# Patient Record
Sex: Female | Born: 1985 | Race: White | Hispanic: No | Smoking: Current some day smoker
Health system: Southern US, Community
[De-identification: ages and names within clinical notes are randomized; demographics above are authoritative.]

## PROBLEM LIST (undated history)

## (undated) DIAGNOSIS — Z789 Other specified health status: Secondary | ICD-10-CM

## (undated) DIAGNOSIS — N200 Calculus of kidney: Secondary | ICD-10-CM

---

## 2017-05-26 DIAGNOSIS — Z23 Encounter for immunization: Secondary | ICD-10-CM | POA: Diagnosis not present

## 2017-05-26 DIAGNOSIS — Z3689 Encounter for other specified antenatal screening: Secondary | ICD-10-CM | POA: Diagnosis not present

## 2017-05-26 LAB — OB RESULTS CONSOLE HEPATITIS B SURFACE ANTIGEN: Hepatitis B Surface Ag: NEGATIVE

## 2017-05-26 LAB — OB RESULTS CONSOLE GC/CHLAMYDIA
Chlamydia: NEGATIVE
GC PROBE AMP, GENITAL: NEGATIVE

## 2017-05-26 LAB — OB RESULTS CONSOLE ABO/RH: RH TYPE: POSITIVE

## 2017-05-26 LAB — OB RESULTS CONSOLE RUBELLA ANTIBODY, IGM: RUBELLA: IMMUNE

## 2017-05-26 LAB — OB RESULTS CONSOLE RPR: RPR: NONREACTIVE

## 2017-05-26 LAB — OB RESULTS CONSOLE ANTIBODY SCREEN: Antibody Screen: NEGATIVE

## 2017-05-26 LAB — OB RESULTS CONSOLE HIV ANTIBODY (ROUTINE TESTING): HIV: NONREACTIVE

## 2017-06-30 DIAGNOSIS — O365931 Maternal care for other known or suspected poor fetal growth, third trimester, fetus 1: Secondary | ICD-10-CM | POA: Diagnosis not present

## 2017-06-30 DIAGNOSIS — Z3A33 33 weeks gestation of pregnancy: Secondary | ICD-10-CM | POA: Diagnosis not present

## 2017-06-30 DIAGNOSIS — O34211 Maternal care for low transverse scar from previous cesarean delivery: Secondary | ICD-10-CM | POA: Diagnosis not present

## 2017-07-19 DIAGNOSIS — Z3685 Encounter for antenatal screening for Streptococcus B: Secondary | ICD-10-CM | POA: Diagnosis not present

## 2017-07-19 LAB — OB RESULTS CONSOLE GBS: GBS: NEGATIVE

## 2017-07-20 DIAGNOSIS — Z3A36 36 weeks gestation of pregnancy: Secondary | ICD-10-CM | POA: Diagnosis not present

## 2017-07-20 DIAGNOSIS — O365930 Maternal care for other known or suspected poor fetal growth, third trimester, not applicable or unspecified: Secondary | ICD-10-CM | POA: Diagnosis not present

## 2017-07-20 DIAGNOSIS — O34211 Maternal care for low transverse scar from previous cesarean delivery: Secondary | ICD-10-CM | POA: Diagnosis not present

## 2017-07-31 ENCOUNTER — Encounter (HOSPITAL_COMMUNITY): Payer: Self-pay | Admitting: *Deleted

## 2017-08-08 NOTE — Patient Instructions (Signed)
Melissa Wiggins  08/08/2017   Your procedure is scheduled on:  08/10/2017  Enter through the Main Entr[facility] of [facility] at 0730 AM.  Pick up the phone at the desk and dial [phone]  Call this number if you have problems the morning of [phone]  Remember:   Do not eat food:(After Midnight) Desps de medianoche.  Do not drink clear liquids: (After Midnight) Desps de medianoche.  Take these medicines the morning of surgery with A SIP OF WATER: none   Do not wear jewelry, make-up or nail polish.  Do not wear lotions, powders, or perfumes. Do not wear deodorant.  Do not shave 48 hours prior to surgery.  Do not bring valuables to the hospital.  [facility] is not   responsible for any belongings or valuables brought to the hospital.  Contacts, dentures or bridgework may not be worn into surgery.  Leave suitcase in the car. After surgery it may be brought to your room.  For patients admitted to the hospital, checkout time is 11:00 AM the day of              discharge.    N/A   Please read over the following fact sheets that you were given:   Surgical Site Infection Prevention

## 2017-08-09 ENCOUNTER — Encounter (HOSPITAL_COMMUNITY)
Admission: RE | Admit: 2017-08-09 | Discharge: 2017-08-09 | Disposition: A | Discharge status: Home / Self Care | Payer: BLUE CROSS/BLUE SHIELD | Source: Ambulatory Visit | Attending: Obstetrics and Gynecology | Admitting: Obstetrics and Gynecology

## 2017-08-09 DIAGNOSIS — O9902 Anemia complicating childbirth: Secondary | ICD-10-CM | POA: Diagnosis not present

## 2017-08-09 DIAGNOSIS — Z3A39 39 weeks gestation of pregnancy: Secondary | ICD-10-CM | POA: Diagnosis not present

## 2017-08-09 DIAGNOSIS — O34211 Maternal care for low transverse scar from previous cesarean delivery: Secondary | ICD-10-CM | POA: Diagnosis not present

## 2017-08-09 DIAGNOSIS — O99334 Smoking (tobacco) complicating childbirth: Secondary | ICD-10-CM | POA: Diagnosis not present

## 2017-08-09 DIAGNOSIS — F17210 Nicotine dependence, cigarettes, uncomplicated: Secondary | ICD-10-CM | POA: Diagnosis not present

## 2017-08-09 DIAGNOSIS — Z23 Encounter for immunization: Secondary | ICD-10-CM | POA: Diagnosis not present

## 2017-08-09 HISTORY — DX: Other specified health status: Z78.9

## 2017-08-09 LAB — TYPE AND SCREEN
ABO/RH(D): A POS
Antibody Screen: NEGATIVE

## 2017-08-09 LAB — CBC
HCT: 31.6 % — ABNORMAL LOW (ref 36.0–46.0)
Hemoglobin: 10.8 g/dL — ABNORMAL LOW (ref 12.0–15.0)
MCH: 30.3 pg (ref 26.0–34.0)
MCHC: 34.2 g/dL (ref 30.0–36.0)
MCV: 88.5 fL (ref 78.0–100.0)
PLATELETS: 240 10*3/uL (ref 150–400)
RBC: 3.57 MIL/uL — AB (ref 3.87–5.11)
RDW: 12.9 % (ref 11.5–15.5)
WBC: 11.1 10*3/uL — AB (ref 4.0–10.5)

## 2017-08-09 LAB — ABO/RH: ABO/RH(D): A POS

## 2017-08-10 ENCOUNTER — Other Ambulatory Visit: Payer: Self-pay

## 2017-08-10 ENCOUNTER — Inpatient Hospital Stay (HOSPITAL_COMMUNITY)
Admission: AD | Admit: 2017-08-10 | Discharge: 2017-08-11 | DRG: 788 | Disposition: A | Discharge status: Home / Self Care | Payer: BLUE CROSS/BLUE SHIELD | Source: Ambulatory Visit | Attending: Obstetrics and Gynecology | Admitting: Obstetrics and Gynecology

## 2017-08-10 ENCOUNTER — Encounter (HOSPITAL_COMMUNITY)
Admission: AD | Disposition: A | Discharge status: Home / Self Care | Payer: Self-pay | Source: Ambulatory Visit | Attending: Obstetrics and Gynecology

## 2017-08-10 ENCOUNTER — Inpatient Hospital Stay (HOSPITAL_COMMUNITY): Payer: BLUE CROSS/BLUE SHIELD | Admitting: Certified Registered Nurse Anesthetist

## 2017-08-10 ENCOUNTER — Encounter (HOSPITAL_COMMUNITY): Payer: Self-pay | Admitting: Certified Registered Nurse Anesthetist

## 2017-08-10 DIAGNOSIS — Z98891 History of uterine scar from previous surgery: Secondary | ICD-10-CM

## 2017-08-10 DIAGNOSIS — O34211 Maternal care for low transverse scar from previous cesarean delivery: Secondary | ICD-10-CM | POA: Diagnosis present

## 2017-08-10 DIAGNOSIS — O34219 Maternal care for unspecified type scar from previous cesarean delivery: Secondary | ICD-10-CM

## 2017-08-10 DIAGNOSIS — O99334 Smoking (tobacco) complicating childbirth: Secondary | ICD-10-CM | POA: Diagnosis present

## 2017-08-10 DIAGNOSIS — Z3A39 39 weeks gestation of pregnancy: Secondary | ICD-10-CM | POA: Diagnosis not present

## 2017-08-10 DIAGNOSIS — F17210 Nicotine dependence, cigarettes, uncomplicated: Secondary | ICD-10-CM | POA: Diagnosis present

## 2017-08-10 LAB — RPR: RPR: NONREACTIVE

## 2017-08-10 SURGERY — Surgical Case
Anesthesia: Spinal

## 2017-08-10 MED ORDER — FENTANYL CITRATE (PF) 100 MCG/2ML IJ SOLN: 25.0000 ug | INTRAMUSCULAR | Status: DC | PRN

## 2017-08-10 MED ORDER — NALBUPHINE HCL 10 MG/ML IJ SOLN: 5.0000 mg | Freq: Once | INTRAMUSCULAR | Status: DC | PRN

## 2017-08-10 MED ORDER — FENTANYL CITRATE (PF) 100 MCG/2ML IJ SOLN
INTRAMUSCULAR | Status: AC
  Filled 2017-08-10: qty 2

## 2017-08-10 MED ORDER — CEFAZOLIN SODIUM-DEXTROSE 2-4 GM/100ML-% IV SOLN
2.0000 g | INTRAVENOUS | Status: AC
  Administered 2017-08-10: 2 g via INTRAVENOUS
  Filled 2017-08-10: qty 100

## 2017-08-10 MED ORDER — ONDANSETRON HCL 4 MG/2ML IJ SOLN
INTRAMUSCULAR | Status: DC | PRN
  Administered 2017-08-10: 4 mg via INTRAVENOUS

## 2017-08-10 MED ORDER — PRENATAL MULTIVITAMIN CH
1.0000 | ORAL_TABLET | Freq: Every day | ORAL | Status: DC
  Administered 2017-08-11: 1 via ORAL
  Filled 2017-08-10: qty 1

## 2017-08-10 MED ORDER — NALBUPHINE HCL 10 MG/ML IJ SOLN: 5.0000 mg | INTRAMUSCULAR | Status: DC | PRN

## 2017-08-10 MED ORDER — SCOPOLAMINE 1 MG/3DAYS TD PT72
1.0000 | MEDICATED_PATCH | Freq: Once | TRANSDERMAL | Status: DC
  Filled 2017-08-10: qty 1

## 2017-08-10 MED ORDER — OXYCODONE HCL 5 MG PO TABS: 10.0000 mg | ORAL_TABLET | ORAL | Status: DC | PRN

## 2017-08-10 MED ORDER — IBUPROFEN 600 MG PO TABS
600.0000 mg | ORAL_TABLET | Freq: Four times a day (QID) | ORAL | Status: DC
  Administered 2017-08-10 – 2017-08-11 (×4): 600 mg via ORAL
  Filled 2017-08-10 (×4): qty 1

## 2017-08-10 MED ORDER — LACTATED RINGERS IV SOLN
INTRAVENOUS | Status: DC
  Administered 2017-08-10 (×2): via INTRAVENOUS

## 2017-08-10 MED ORDER — DIPHENHYDRAMINE HCL 25 MG PO CAPS: 25.0000 mg | ORAL_CAPSULE | Freq: Four times a day (QID) | ORAL | Status: DC | PRN

## 2017-08-10 MED ORDER — ONDANSETRON HCL 4 MG/2ML IJ SOLN: 4.0000 mg | Freq: Three times a day (TID) | INTRAMUSCULAR | Status: DC | PRN

## 2017-08-10 MED ORDER — SIMETHICONE 80 MG PO CHEW
80.0000 mg | CHEWABLE_TABLET | Freq: Three times a day (TID) | ORAL | Status: DC
  Administered 2017-08-10 – 2017-08-11 (×2): 80 mg via ORAL
  Filled 2017-08-10 (×2): qty 1

## 2017-08-10 MED ORDER — ACETAMINOPHEN 325 MG PO TABS: 650.0000 mg | ORAL_TABLET | ORAL | Status: DC | PRN

## 2017-08-10 MED ORDER — DEXAMETHASONE SODIUM PHOSPHATE 10 MG/ML IJ SOLN
INTRAMUSCULAR | Status: DC | PRN
  Administered 2017-08-10: 4 mg via INTRAVENOUS

## 2017-08-10 MED ORDER — LACTATED RINGERS IV SOLN
INTRAVENOUS | Status: DC
  Administered 2017-08-10 (×2): 125 mL/h via INTRAVENOUS

## 2017-08-10 MED ORDER — OXYTOCIN 40 UNITS IN LACTATED RINGERS INFUSION - SIMPLE MED: 2.5000 [IU]/h | INTRAVENOUS | Status: AC

## 2017-08-10 MED ORDER — DEXAMETHASONE SODIUM PHOSPHATE 4 MG/ML IJ SOLN
INTRAMUSCULAR | Status: AC
Start: 2017-08-10 — End: ?
  Filled 2017-08-10: qty 1

## 2017-08-10 MED ORDER — NALOXONE HCL 4 MG/10ML IJ SOLN
1.0000 ug/kg/h | INTRAVENOUS | Status: DC | PRN
  Filled 2017-08-10: qty 5

## 2017-08-10 MED ORDER — PROMETHAZINE HCL 25 MG/ML IJ SOLN: 6.2500 mg | INTRAMUSCULAR | Status: DC | PRN

## 2017-08-10 MED ORDER — DIBUCAINE 1 % RE OINT: 1.0000 "application " | TOPICAL_OINTMENT | RECTAL | Status: DC | PRN

## 2017-08-10 MED ORDER — SOD CITRATE-CITRIC ACID 500-334 MG/5ML PO SOLN
30.0000 mL | Freq: Once | ORAL | Status: AC
  Administered 2017-08-10: 30 mL via ORAL
  Filled 2017-08-10: qty 15

## 2017-08-10 MED ORDER — SODIUM CHLORIDE 0.9% FLUSH: 3.0000 mL | INTRAVENOUS | Status: DC | PRN

## 2017-08-10 MED ORDER — NALOXONE HCL 0.4 MG/ML IJ SOLN: 0.4000 mg | INTRAMUSCULAR | Status: DC | PRN

## 2017-08-10 MED ORDER — PHENYLEPHRINE 8 MG IN D5W 100 ML (0.08MG/ML) PREMIX OPTIME
INJECTION | INTRAVENOUS | Status: DC | PRN
  Administered 2017-08-10: 60 ug/min via INTRAVENOUS

## 2017-08-10 MED ORDER — FAMOTIDINE 20 MG PO TABS
20.0000 mg | ORAL_TABLET | Freq: Once | ORAL | Status: AC
  Administered 2017-08-10: 20 mg via ORAL
  Filled 2017-08-10: qty 1

## 2017-08-10 MED ORDER — PHENYLEPHRINE 40 MCG/ML (10ML) SYRINGE FOR IV PUSH (FOR BLOOD PRESSURE SUPPORT)
PREFILLED_SYRINGE | INTRAVENOUS | Status: AC
  Filled 2017-08-10: qty 10

## 2017-08-10 MED ORDER — KETOROLAC TROMETHAMINE 30 MG/ML IJ SOLN
INTRAMUSCULAR | Status: AC
  Administered 2017-08-10: 30 mg via INTRAMUSCULAR
  Filled 2017-08-10: qty 1

## 2017-08-10 MED ORDER — KETOROLAC TROMETHAMINE 30 MG/ML IJ SOLN: 30.0000 mg | Freq: Four times a day (QID) | INTRAMUSCULAR | Status: AC | PRN

## 2017-08-10 MED ORDER — MORPHINE SULFATE (PF) 0.5 MG/ML IJ SOLN
INTRAMUSCULAR | Status: AC
  Filled 2017-08-10: qty 10

## 2017-08-10 MED ORDER — FENTANYL CITRATE (PF) 100 MCG/2ML IJ SOLN
INTRAMUSCULAR | Status: DC | PRN
  Administered 2017-08-10: 10 ug via INTRATHECAL

## 2017-08-10 MED ORDER — COCONUT OIL OIL: 1.0000 "application " | TOPICAL_OIL | Status: DC | PRN

## 2017-08-10 MED ORDER — LACTATED RINGERS IV SOLN
INTRAVENOUS | Status: DC | PRN
Start: 2017-08-10 — End: 2017-08-10
  Administered 2017-08-10: 10:00:00 via INTRAVENOUS

## 2017-08-10 MED ORDER — MEPERIDINE HCL 25 MG/ML IJ SOLN: 6.2500 mg | INTRAMUSCULAR | Status: DC | PRN

## 2017-08-10 MED ORDER — WITCH HAZEL-GLYCERIN EX PADS: 1.0000 "application " | MEDICATED_PAD | CUTANEOUS | Status: DC | PRN

## 2017-08-10 MED ORDER — TETANUS-DIPHTH-ACELL PERTUSSIS 5-2.5-18.5 LF-MCG/0.5 IM SUSP: 0.5000 mL | Freq: Once | INTRAMUSCULAR | Status: DC

## 2017-08-10 MED ORDER — DIPHENHYDRAMINE HCL 50 MG/ML IJ SOLN: 12.5000 mg | INTRAMUSCULAR | Status: DC | PRN

## 2017-08-10 MED ORDER — ONDANSETRON HCL 4 MG/2ML IJ SOLN
INTRAMUSCULAR | Status: AC
  Filled 2017-08-10: qty 2

## 2017-08-10 MED ORDER — MORPHINE SULFATE (PF) 0.5 MG/ML IJ SOLN
INTRAMUSCULAR | Status: DC | PRN
  Administered 2017-08-10: .2 mg via INTRATHECAL

## 2017-08-10 MED ORDER — MENTHOL 3 MG MT LOZG: 1.0000 | LOZENGE | OROMUCOSAL | Status: DC | PRN

## 2017-08-10 MED ORDER — OXYTOCIN 10 UNIT/ML IJ SOLN
INTRAVENOUS | Status: DC | PRN
  Administered 2017-08-10: 40 [IU] via INTRAVENOUS

## 2017-08-10 MED ORDER — OXYCODONE HCL 5 MG PO TABS: 5.0000 mg | ORAL_TABLET | ORAL | Status: DC | PRN

## 2017-08-10 MED ORDER — PHENYLEPHRINE 8 MG IN D5W 100 ML (0.08MG/ML) PREMIX OPTIME
INJECTION | INTRAVENOUS | Status: AC
  Filled 2017-08-10: qty 100

## 2017-08-10 MED ORDER — SENNOSIDES-DOCUSATE SODIUM 8.6-50 MG PO TABS
2.0000 | ORAL_TABLET | ORAL | Status: DC
  Administered 2017-08-11: 2 via ORAL
  Filled 2017-08-10: qty 2

## 2017-08-10 MED ORDER — SCOPOLAMINE 1 MG/3DAYS TD PT72
1.0000 | MEDICATED_PATCH | Freq: Once | TRANSDERMAL | Status: DC
  Administered 2017-08-10: 1.5 mg via TRANSDERMAL

## 2017-08-10 MED ORDER — SIMETHICONE 80 MG PO CHEW: 80.0000 mg | CHEWABLE_TABLET | ORAL | Status: DC | PRN

## 2017-08-10 MED ORDER — KETOROLAC TROMETHAMINE 30 MG/ML IJ SOLN
30.0000 mg | Freq: Four times a day (QID) | INTRAMUSCULAR | Status: AC | PRN
  Administered 2017-08-10: 30 mg via INTRAMUSCULAR

## 2017-08-10 MED ORDER — BUPIVACAINE IN DEXTROSE 0.75-8.25 % IT SOLN
INTRATHECAL | Status: DC | PRN
  Administered 2017-08-10: 1.6 mL via INTRATHECAL

## 2017-08-10 MED ORDER — SIMETHICONE 80 MG PO CHEW
80.0000 mg | CHEWABLE_TABLET | ORAL | Status: DC
  Administered 2017-08-11: 80 mg via ORAL
  Filled 2017-08-10: qty 1

## 2017-08-10 MED ORDER — DIPHENHYDRAMINE HCL 25 MG PO CAPS
25.0000 mg | ORAL_CAPSULE | ORAL | Status: DC | PRN
  Filled 2017-08-10: qty 1

## 2017-08-10 MED ORDER — ZOLPIDEM TARTRATE 5 MG PO TABS: 5.0000 mg | ORAL_TABLET | Freq: Every evening | ORAL | Status: DC | PRN

## 2017-08-10 MED ORDER — OXYTOCIN 10 UNIT/ML IJ SOLN
INTRAMUSCULAR | Status: AC
  Filled 2017-08-10: qty 4

## 2017-08-10 MED ORDER — ACETAMINOPHEN 500 MG PO TABS
1000.0000 mg | ORAL_TABLET | Freq: Four times a day (QID) | ORAL | Status: AC
  Administered 2017-08-10 – 2017-08-11 (×4): 1000 mg via ORAL
  Filled 2017-08-10 (×4): qty 2

## 2017-08-10 SURGICAL SUPPLY — 35 items
BENZOIN TINCTURE PRP APPL 2/3 (GAUZE/BANDAGES/DRESSINGS) ×6 IMPLANT
CHLORAPREP W/TINT 26ML (MISCELLANEOUS) ×3 IMPLANT
CLAMP CORD UMBIL (MISCELLANEOUS) ×3 IMPLANT
CLOSURE WOUND 1/2 X4 (GAUZE/BANDAGES/DRESSINGS) ×2
CLOTH BEACON ORANGE TIMEOUT ST (SAFETY) ×3 IMPLANT
DRAPE C SECTION CLR SCREEN (DRAPES) ×3 IMPLANT
DRSG OPSITE POSTOP 4X10 (GAUZE/BANDAGES/DRESSINGS) ×3 IMPLANT
ELECT REM PT RETURN 9FT ADLT (ELECTROSURGICAL) ×3
ELECTRODE REM PT RTRN 9FT ADLT (ELECTROSURGICAL) ×1 IMPLANT
GLOVE BIO SURGEON STRL SZ 6.5 (GLOVE) ×2 IMPLANT
GLOVE BIO SURGEONS STRL SZ 6.5 (GLOVE) ×1
GLOVE BIOGEL PI IND STRL 7.0 (GLOVE) ×2 IMPLANT
GLOVE BIOGEL PI INDICATOR 7.0 (GLOVE) ×4
GOWN STRL REUS W/TWL LRG LVL3 (GOWN DISPOSABLE) ×6 IMPLANT
HEMOSTAT ARISTA ABSORB 3G PWDR (MISCELLANEOUS) ×3 IMPLANT
NS IRRIG 1000ML POUR BTL (IV SOLUTION) ×3 IMPLANT
PACK C SECTION WH (CUSTOM PROCEDURE TRAY) ×3 IMPLANT
PAD OB MATERNITY 4.3X12.25 (PERSONAL CARE ITEMS) ×3 IMPLANT
RETRACTOR WND ALEXIS 25 LRG (MISCELLANEOUS) ×1 IMPLANT
RTRCTR C-SECT PINK 25CM LRG (MISCELLANEOUS) ×3 IMPLANT
RTRCTR WOUND ALEXIS 25CM LRG (MISCELLANEOUS) ×3
STRIP CLOSURE SKIN 1/2X4 (GAUZE/BANDAGES/DRESSINGS) ×4 IMPLANT
SUT CHROMIC 1 CTX 36 (SUTURE) ×6 IMPLANT
SUT PLAIN 2 0 XLH (SUTURE) ×3 IMPLANT
SUT VIC AB 0 CT1 27 (SUTURE) ×4
SUT VIC AB 0 CT1 27XBRD ANBCTR (SUTURE) ×2 IMPLANT
SUT VIC AB 2-0 CT1 27 (SUTURE) ×2
SUT VIC AB 2-0 CT1 TAPERPNT 27 (SUTURE) ×1 IMPLANT
SUT VIC AB 3-0 SH 27 (SUTURE) ×2
SUT VIC AB 3-0 SH 27X BRD (SUTURE) ×1 IMPLANT
SUT VIC AB 4-0 KS 27 (SUTURE) ×3 IMPLANT
SUT VIC AB 4-0 SH 27 (SUTURE) ×2
SUT VIC AB 4-0 SH 27XANBCTRL (SUTURE) ×1 IMPLANT
TOWEL OR 17X24 6PK STRL BLUE (TOWEL DISPOSABLE) ×3 IMPLANT
TRAY FOLEY W/BAG SLVR 14FR LF (SET/KITS/TRAYS/PACK) ×3 IMPLANT

## 2017-08-10 NOTE — Anesthesia Procedure Notes (Signed)
Spinal  Patient location during procedure: OR Start time: 08/10/2017 9:31 AM End time: 08/10/2017 9:34 AM Staffing Anesthesiologist: Cecile Hearingurk, Stephen Edward, MD Performed: anesthesiologist  Preanesthetic Checklist Completed: patient identified, surgical consent, pre-op evaluation, timeout performed, IV checked, risks and benefits discussed and monitors and equipment checked Spinal Block Patient position: sitting Prep: site prepped and draped and DuraPrep Patient monitoring: continuous pulse ox and blood pressure Approach: midline Location: L3-4 Injection technique: single-shot Needle Needle type: Pencan  Needle gauge: 24 G Assessment Sensory level: T4 Additional Notes Functioning IV was confirmed and monitors were applied. Sterile prep and drape, including hand hygiene, mask and sterile gloves were used. The patient was positioned and the spine was prepped. The skin was anesthetized with lidocaine.  Free flow of clear CSF was obtained prior to injecting local anesthetic into the CSF.  The spinal needle aspirated freely following injection.  The needle was carefully withdrawn.  The patient tolerated the procedure well. Consent was obtained prior to procedure with all questions answered and concerns addressed. Risks including but not limited to bleeding, infection, nerve damage, paralysis, failed block, inadequate analgesia, allergic reaction, high spinal, itching and headache were discussed and the patient wished to proceed.   Arrie AranStephen Turk, MD

## 2017-08-10 NOTE — Transfer of Care (Signed)
Immediate Anesthesia Transfer of Care Note  Patient: Melissa Wiggins  Procedure(s) Performed: REPEAT CESAREAN SECTION (N/A )  Patient Location: PACU  Anesthesia Type:Spinal  Level of Consciousness: awake, alert  and oriented  Airway & Oxygen Therapy: Patient Spontanous Breathing  Post-op Assessment: Report given to RN and Post -op Vital signs reviewed and stable  Post vital signs: Reviewed and stable  Last Vitals:  Vitals Value Taken Time  BP    Temp    Pulse    Resp    SpO2      Last Pain:  Vitals:   08/10/17 0805  TempSrc: Oral      Patients Stated Pain Goal: 4 (08/10/17 0757)  Complications: No apparent anesthesia complications

## 2017-08-10 NOTE — Consult Note (Signed)
Neonatology Note:   Attendance at C-section:    I was asked by Dr. Banga to attend this repeat C/S at term. The mother is a G3P1, GBS negative with good prenatal care. ROM 0 hours before delivery, fluid clear. Infant vigorous with good spontaneous cry and tone. Needed only minimal bulb suctioning. Ap 8/9. Lungs clear to ausc in DR. To CN to care of Pediatrician.  Amethyst Gainer C. Dharma Pare, MD 

## 2017-08-10 NOTE — Op Note (Signed)
Operative Note    Preoperative Diagnosis: 1. Prior cesarean section desiring elective repeat                                              2. IUP at 39 weeks   Postoperative Diagnosis: Same   Procedure: Repeat low transverse cesarean section   Surgeon: Britt BottomBanga C DO Assist: Surgical Assist Marchelle FolksHeather Krietenmeyer  Anesthesia: Spinal   Fluids: LR 1900ml EBL: 738ml UOP:16225ml   Findings: viable female infant in cephalic position; grossly normal uterus, tubes and ovaries   Specimen: placenta to pathology   Procedure Note Consent verified pre-op. All questions answered   Patient was taken to the operating room where spinal anesthesia was administered. She was prepped and draped in the normal sterile fashion and placed in the dorsal supine position with a leftward tilt. An appropriate time out was performed. Her old incision was removed with the scalpel and carried through to the underlying layer of fascia. The fascia was nicked in the midline and the incision was extended laterally with Mayo scissors. The superior and inferior aspects of the incision were grasped Coker clamps and dissected off the underlying rectus muscles.Rectus muscles were separated in the midline  and the peritoneal cavity entered bluntly. The peritoneal incision was then extended both superiorly and inferiorly with careful attention to avoid both bowel and bladder. The Alexis self-retaining wound retractor was then placed and the lower uterine segment exposed. The bladder flap was developed with Metzenbaum scissors and pushed away from the lower uterine segment. The lower uterine segment was then incised in a transverse fashion and the cavity itself entered bluntly. The incision was extended bluntly. The infant's head was then lifted and delivered from the incision without difficulty. Vigorous spontaneous cry was noted during delivery.  The remainder of the infant delivered and the nose and mouth bulb suctioned. The cord was  clamped and cut after a minute delay. The infant was handed off to the waiting pediatricians. The placenta was then manually expressed from the uterus and the uterus cleared of all clots and debris with moist lap sponge. The uterine incision was then repaired in 2 layers the first layer was a running locked layer 1-0 chromic and the second an imbricating layer of the same suture. There was an extension of the incision into the broad ligament on the left- although hemostatic, this was repaired with 3-0 vicryl on an SH in a running fashion.  The tubes and ovaries were inspected and the gutters cleared of all clots and debris. The uterine incision was inspected again and found to be hemostatic. All instruments and sponges were then removed from the abdomen. The peritoneum was then reapproximated in a running fashion with sutures of 2-0 Vicryl; the rectus was incorporated for support.The fascia was then closed with 0 Vicryl in a running fashion. Subcutaneous tissue was reapproximated with 3-0 plain in a running fashion. The skin was closed with a subcuticular stitch of 4-0 Vicryl on a Keith needle and then reinforced with benzoin and Steri-Strips. At the conclusion of the procedure all instruments and sponge counts were correct. Patient was taken to the recovery room in good condition with her baby accompanying her skin to skin.  Apgars 9,9

## 2017-08-10 NOTE — Anesthesia Postprocedure Evaluation (Signed)
Anesthesia Post Note  Patient: Percell Beltmanda Brocious  Procedure(s) Performed: REPEAT CESAREAN SECTION (N/A )     Patient location during evaluation: PACU Anesthesia Type: Spinal Level of consciousness: oriented and awake and alert Pain management: pain level controlled Vital Signs Assessment: post-procedure vital signs reviewed and stable Respiratory status: spontaneous breathing and respiratory function stable Cardiovascular status: blood pressure returned to baseline and stable Postop Assessment: no headache, no backache and no apparent nausea or vomiting Anesthetic complications: no    Last Vitals:  Vitals:   08/10/17 1705 08/10/17 1707  BP:    Pulse:    Resp:    Temp:    SpO2: 99% 100%    Last Pain:  Vitals:   08/10/17 1659  TempSrc: Oral  PainSc:    Pain Goal: Patients Stated Pain Goal: 0 (08/10/17 1145)               Lowella CurbWarren Ray Leondro Coryell

## 2017-08-10 NOTE — H&P (Signed)
Melissa Wiggins is a 32 y.o. G93P1011 female presenting at 11 0/[redacted]wks gestation for scheduled elective repeat cesarean section. She had her first cesarean due to breech presentation. Pt transferred into my practice at [redacted] weeks gestation. She had declined all genetic and elective screening tests. She has a history of depression but was controlled with no meds throughout pregnancy. She is slightly anxious as expected today.  OB History    Gravida  3   Para  1   Term  1   Preterm      AB  1   Living  1     SAB      TAB  1   Ectopic      Multiple      Live Births  1          Past Medical History:  Diagnosis Date  . Medical history non-contributory    Past Surgical History:  Procedure Laterality Date  . CESAREAN SECTION     Family History: family history includes Diabetes in her father. Social History:  reports that she has been smoking.  She has been smoking about 0.25 packs per day. She has never used smokeless tobacco. She reports that she drank alcohol. She reports that she does not use drugs.     Maternal Diabetes: No Genetic Screening: Declined Maternal Ultrasounds/Referrals: Normal Fetal Ultrasounds or other Referrals:  None Maternal Substance Abuse:  No Significant Maternal Medications:  None Significant Maternal Lab Results:  Lab values include: Group B Strep negative Other Comments:  None  Review of Systems  Constitutional: Negative for chills, fever, malaise/fatigue and weight loss.  Eyes: Negative for blurred vision and double vision.  Respiratory: Negative for shortness of breath.   Cardiovascular: Negative for chest pain.  Gastrointestinal: Negative for abdominal pain, heartburn, nausea and vomiting.  Genitourinary: Negative for dysuria.  Musculoskeletal: Negative for back pain and myalgias.  Skin: Negative for itching and rash.  Neurological: Negative for dizziness and headaches.  Endo/Heme/Allergies: Does not bruise/bleed easily.   Psychiatric/Behavioral: Negative for depression, hallucinations, substance abuse and suicidal ideas. The patient is nervous/anxious.    Maternal Medical History:  Reason for admission: Nausea. Scheduled for repeat cesarean section - elective  Contractions: Frequency: rare.    Fetal activity: Perceived fetal activity is normal.   Last perceived fetal movement was within the past hour.    Prenatal complications: no prenatal complications Prenatal Complications - Diabetes: none.      Blood pressure 111/70, pulse 98, temperature 98.3 F (36.8 C), temperature source Oral, resp. rate 18, height 5\' 5"  (1.651 m), weight 157 lb 9.6 oz (71.5 kg), last menstrual period 11/10/2016. Maternal Exam:  Uterine Assessment: Contraction frequency is rare.   Abdomen: Patient reports generalized tenderness.  Estimated fetal weight is AGA.   Fetal presentation: vertex  Introitus: Normal vulva. Vulva is negative for condylomata and lesion.  Normal vagina.  Vagina is negative for condylomata.    Physical Exam  Constitutional: She is oriented to person, place, and time. She appears well-developed and well-nourished.  Eyes: Pupils are equal, round, and reactive to light.  Neck: Normal range of motion.  Cardiovascular: Normal rate.  Respiratory: Effort normal.  GI: Soft. There is generalized tenderness.  Genitourinary: Vagina normal and uterus normal. Vulva exhibits no lesion.  Musculoskeletal: Normal range of motion. She exhibits no edema.  Neurological: She is alert and oriented to person, place, and time. She has normal reflexes.  Skin: Skin is warm and dry.  Psychiatric: She has  a normal mood and affect. Her behavior is normal. Judgment and thought content normal.    Prenatal labs: ABO, Rh: --/--/A POS, A POS Performed at [facility], [address] [place], [place] [address]  [phone](06/19 1545) Antibody: NEG (06/19 1545) Rubella: Immune (04/05 0000) RPR: Non Reactive (06/19 1545)  HBsAg:  Negative (04/05 0000)  HIV: Non-reactive (04/05 0000)  GBS: Negative (05/29 0000)   Assessment/Plan: [id number] at 6839 0/[redacted]wks gestation for elective repeat cesarean section Risks and benefits of procedure have been discussed and all questions answered To OR when ready for repeat cesarean section  Cecilia W Banga 08/10/2017, 9:17 AM

## 2017-08-10 NOTE — Anesthesia Preprocedure Evaluation (Addendum)
Anesthesia Evaluation  Patient identified by MRN, date of birth, ID band Patient awake    Reviewed: Allergy & Precautions, NPO status , Patient's Chart, lab work & pertinent test results  Airway Mallampati: II  TM Distance: >3 FB Neck ROM: Full    Dental  (+) Teeth Intact, Dental Advisory Given   Pulmonary Current Smoker,    Pulmonary exam normal breath sounds clear to auscultation       Cardiovascular Exercise Tolerance: Good negative cardio ROS Normal cardiovascular exam Rhythm:Regular Rate:Normal     Neuro/Psych negative neurological ROS  negative psych ROS   GI/Hepatic negative GI ROS, Neg liver ROS,   Endo/Other  negative endocrine ROS  Renal/GU negative Renal ROS     Musculoskeletal negative musculoskeletal ROS (+)   Abdominal   Peds  Hematology  (+) Blood dyscrasia, anemia , Plt 240k   Anesthesia Other Findings Day of surgery medications reviewed with the patient.  Reproductive/Obstetrics (+) Pregnancy H/o C-section                              Anesthesia Physical Anesthesia Plan  ASA: II  Anesthesia Plan: Spinal   Post-op Pain Management:    Induction:   PONV Risk Score and Plan: 1 and Scopolamine patch - Pre-op, Treatment may vary due to age or medical condition, Dexamethasone and Ondansetron  Airway Management Planned: Natural Airway  Additional Equipment:   Intra-op Plan:   Post-operative Plan:   Informed Consent: I have reviewed the patients History and Physical, chart, labs and discussed the procedure including the risks, benefits and alternatives for the proposed anesthesia with the patient or authorized representative who has indicated his/her understanding and acceptance.   Dental advisory given  Plan Discussed with: CRNA, Anesthesiologist and Surgeon  Anesthesia Plan Comments: (Discussed risks and benefits of and differences between spinal and general.  Discussed risks of spinal including headache, backache, failure, bleeding, infection, and nerve damage. Patient consents to spinal. Questions answered. Coagulation studies and platelet count acceptable.  Patient states she required anxiolytics after last baby delivered while still in OR.)       Anesthesia Quick Evaluation

## 2017-08-11 LAB — CBC
HCT: 26.5 % — ABNORMAL LOW (ref 36.0–46.0)
Hemoglobin: 9.2 g/dL — ABNORMAL LOW (ref 12.0–15.0)
MCH: 31.2 pg (ref 26.0–34.0)
MCHC: 34.7 g/dL (ref 30.0–36.0)
MCV: 89.8 fL (ref 78.0–100.0)
PLATELETS: 221 10*3/uL (ref 150–400)
RBC: 2.95 MIL/uL — AB (ref 3.87–5.11)
RDW: 13.3 % (ref 11.5–15.5)
WBC: 12.4 10*3/uL — AB (ref 4.0–10.5)

## 2017-08-11 LAB — BIRTH TISSUE RECOVERY COLLECTION (PLACENTA DONATION)

## 2017-08-11 MED ORDER — IBUPROFEN 600 MG PO TABS: 600.0000 mg | ORAL_TABLET | Freq: Four times a day (QID) | ORAL | 1 refills | Status: DC | PRN

## 2017-08-11 MED ORDER — OXYCODONE HCL 5 MG PO TABS: 5.0000 mg | ORAL_TABLET | ORAL | 0 refills | Status: AC | PRN

## 2017-08-11 NOTE — Discharge Summary (Signed)
OB Discharge Summary     Patient Name: Melissa Wiggins DOB: February 17, 1986 MRN: [id number]  Date of admission: 08/10/2017 Delivering MD: Pryor OchoaBANGA, [facility]   Date of discharge: 08/11/2017  Admitting diagnosis: repeat c-section Intrauterine pregnancy: 6472w0d     Secondary diagnosis:  Active Problems:   Previous cesarean delivery affecting pregnancy   Status post repeat low transverse cesarean section   Postpartum care following cesarean delivery  Additional problems: none     Discharge diagnosis: Term Pregnancy Delivered                                                                                                Post partum procedures:none  Augmentation: n/a  Complications: None  Hospital course:  Sceduled C/S   32 y.o. yo [id number] at 7772w0d was admitted to the hospital 08/10/2017 for scheduled cesarean section with the following indication:Elective Repeat.  Membrane Rupture Time/Date: 9:55 AM ,08/10/2017   Patient delivered a Viable infant.08/10/2017  Details of operation can be found in separate operative note.  Pateint had an uncomplicated postpartum course.  She is ambulating, tolerating a regular diet, passing flatus, and urinating well. Patient is discharged home in stable condition on  08/11/17         Physical exam  Vitals:   08/10/17 2255 08/11/17 0053 08/11/17 0531 08/11/17 0730  BP:  99/66 99/61 103/63  Pulse:  67 60 62  Resp:      Temp:  98.3 F (36.8 C) 97.8 F (36.6 C) 97.7 F (36.5 C)  TempSrc:  Oral Oral   SpO2: 98% 99% 99% 99%  Weight:      Height:       General: alert, cooperative and no distress Lochia: appropriate Uterine Fundus: firm; tender Incision: Dressing is clean, dry, and intact DVT Evaluation: No evidence of DVT seen on physical exam. Labs: Lab Results  Component Value Date   WBC 12.4 (H) 08/11/2017   HGB 9.2 (L) 08/11/2017   HCT 26.5 (L) 08/11/2017   MCV 89.8 08/11/2017   PLT 221 08/11/2017   No flowsheet data found.  Discharge  instruction: per After Visit Summary and "Baby and Me Booklet".  After visit meds:  Allergies as of 08/11/2017   No Known Allergies     Medication List    TAKE these medications   acetaminophen 500 MG tablet Commonly known as:  TYLENOL Take 1,000 mg by mouth every 6 (six) hours as needed (for pain.).   ibuprofen 600 MG tablet Commonly known as:  ADVIL,MOTRIN Take 1 tablet (600 mg total) by mouth every 6 (six) hours as needed.   oxyCODONE 5 MG immediate release tablet Commonly known as:  Oxy IR/ROXICODONE Take 1 tablet (5 mg total) by mouth every 4 (four) hours as needed for up to 7 days for severe pain (pain scale 4-7).   PRENATAL PO Take 1 tablet by mouth at bedtime.       Diet: routine diet  Activity: Advance as tolerated. Pelvic rest for 6 weeks.   Outpatient follow up:2 weeks Follow up Appt:No future appointments. Follow up Visit:No follow-ups on file.  Postpartum contraception: Undecided  Newborn Data: Live born female  Birth Weight: 6 lb 13.2 oz (3095 g) APGAR: 8, 9  Newborn Delivery   Birth date/time:  08/10/2017 09:55:00 Delivery type:  C-Section, Low Transverse Trial of labor:  No C-section categorization:  Repeat     Baby Feeding: Breast Disposition:home with mother   08/11/2017 Cathrine Muster, DO

## 2017-08-11 NOTE — Lactation Note (Signed)
This note was copied from a baby's chart. Lactation Consultation Note Baby 19 hrs old. Mom has flat affect. No eye contact w/LC. Mom polite but didn't act as if she wanted to talk w/LC. [facility] Mom has only BF 2 times since delivery. Mostly formula feeding.  Mom stated she BF her 1st child 2 weeks mainly for colostrum. That is what she plans on doing w/this baby. Encouraged mom to call for assistance or questions. Mom encouraged to feed baby 8-12 times/24 hours and with feeding cues.  WH/LC brochure given w/resources, support groups and LC services.  Patient Name: Melissa Wiggins ZOXWR'UToday's Date: 08/11/2017 Reason for consult: Initial assessment   Maternal Data Has patient been taught Hand Expression?: Yes Does the patient have breastfeeding experience prior to this delivery?: Yes  Feeding Feeding Type: Bottle Fed - Formula Nipple Type: Slow - flow  LATCH Score                   Interventions    Lactation Tools Discussed/Used     Consult Status Consult Status: PRN Date: 08/12/17 Follow-up type: In-patient    Charyl DancerCARVER, Breylon Sherrow G 08/11/2017, 5:49 AM

## 2017-08-11 NOTE — Discharge Instructions (Signed)
Nothing in vagina for 6 weeks.  No sex, tampons, and douching.  Other instructions as in [facility] Discharge Booklet. °

## 2017-08-11 NOTE — Progress Notes (Signed)
CSW received consult for hx of Anxiety and Depression.  CSW met with MOB to offer support and complete assessment.    When CSW arrived, MOB was resting in bed and infant was asleep in the bassinet.  CSW explained CSW's role and MOB was inviting. CSW shared that MOB is exciting about being a new mother again but is ready to discharge home.  CSW asked about MOB MH hx and MOB knowledged a hx of anxiety and depression.  MOB reported that MOB has not had any signs or symptoms in over 5 years and has not had any medications.   CSW provided education regarding the baby blues period vs. perinatal mood disorders, discussed treatment and gave resources for mental health follow up if concerns arise.  CSW recommends self-evaluation during the postpartum time period using the New Mom Checklist from Postpartum Progress and encouraged MOB to contact a medical professional if symptoms are noted at any time.  CSW appeared to be insightful and did not display any acute symptoms during the assessment.  CSW assessed for SI and HI and MOB denied them. MOB reported having all necessary items for infant and having a good support team.     CSW identifies no further need for intervention and no barriers to discharge at this time.   Boyd-Gilyard, MSW, LCSW Clinical Social Work [phone] 

## 2017-08-11 NOTE — Progress Notes (Signed)
Patient ID: Melissa Wiggins, female   DOB: Sep 21, 1985, 32 y.o.   MRN: [id number] Pt doing well with no complaints: pain well controlled. Ambulating and tolerating PO with no issues. Requests early discharge to home. Understands baby still needs all screenings done  VSS ABD - FF; dressing c/d/i EXT - no homans  12.4>9.2<221  A/P: POD#1 s/p repeat c/s - stable        Routine care        Possible discharge to home later today

## 2017-09-21 DIAGNOSIS — Z124 Encounter for screening for malignant neoplasm of cervix: Secondary | ICD-10-CM | POA: Diagnosis not present

## 2017-10-31 ENCOUNTER — Other Ambulatory Visit: Payer: Self-pay

## 2017-10-31 ENCOUNTER — Emergency Department (HOSPITAL_COMMUNITY)
Admission: EM | Admit: 2017-10-31 | Discharge: 2017-11-01 | Disposition: A | Discharge status: Home / Self Care | Payer: BLUE CROSS/BLUE SHIELD | Attending: Emergency Medicine | Admitting: Emergency Medicine

## 2017-10-31 ENCOUNTER — Encounter (HOSPITAL_COMMUNITY): Payer: Self-pay | Admitting: *Deleted

## 2017-10-31 DIAGNOSIS — R109 Unspecified abdominal pain: Secondary | ICD-10-CM

## 2017-10-31 DIAGNOSIS — Z87442 Personal history of urinary calculi: Secondary | ICD-10-CM | POA: Insufficient documentation

## 2017-10-31 DIAGNOSIS — F17200 Nicotine dependence, unspecified, uncomplicated: Secondary | ICD-10-CM | POA: Insufficient documentation

## 2017-10-31 DIAGNOSIS — R1032 Left lower quadrant pain: Secondary | ICD-10-CM | POA: Insufficient documentation

## 2017-10-31 HISTORY — DX: Calculus of kidney: N20.0

## 2017-10-31 LAB — URINALYSIS, ROUTINE W REFLEX MICROSCOPIC
Bacteria, UA: NONE SEEN
Bilirubin Urine: NEGATIVE
GLUCOSE, UA: NEGATIVE mg/dL
Ketones, ur: NEGATIVE mg/dL
Leukocytes, UA: NEGATIVE
Nitrite: NEGATIVE
Protein, ur: 100 mg/dL — AB
RBC / HPF: >50 RBC/hpf — ABNORMAL HIGH (ref 0–5)
SPECIFIC GRAVITY, URINE: 1.02 (ref 1.005–1.030)
pH: 8 (ref 5.0–8.0)

## 2017-10-31 LAB — PREGNANCY, URINE: Preg Test, Ur: NEGATIVE

## 2017-10-31 NOTE — ED Notes (Signed)
Bed: WTR6 Expected date: 11/01/17 Expected time: 11:00 AM Means of arrival:  Comments:

## 2017-10-31 NOTE — ED Triage Notes (Signed)
Pt stated "I had really strange pain in the nether region and then it started in my back (pt indicates left flank).  I had kidney stones when I was 18.  I almost threw up getting in the car to come here."

## 2017-11-01 MED ORDER — ONDANSETRON HCL 4 MG/2ML IJ SOLN
4.0000 mg | Freq: Once | INTRAMUSCULAR | Status: AC
  Administered 2017-11-01: 4 mg via INTRAVENOUS
  Filled 2017-11-01: qty 2

## 2017-11-01 MED ORDER — KETOROLAC TROMETHAMINE 30 MG/ML IJ SOLN
30.0000 mg | Freq: Once | INTRAMUSCULAR | Status: AC
  Administered 2017-11-01: 30 mg via INTRAVENOUS
  Filled 2017-11-01: qty 1

## 2017-11-01 MED ORDER — IBUPROFEN 800 MG PO TABS: 800.0000 mg | ORAL_TABLET | Freq: Three times a day (TID) | ORAL | 0 refills | Status: DC | PRN

## 2017-11-01 MED ORDER — HYDROCODONE-ACETAMINOPHEN 5-325 MG PO TABS: 1.0000 | ORAL_TABLET | ORAL | 0 refills | Status: DC | PRN

## 2017-11-01 MED ORDER — ONDANSETRON 4 MG PO TBDP: 4.0000 mg | ORAL_TABLET | Freq: Four times a day (QID) | ORAL | 0 refills | Status: DC | PRN

## 2017-11-01 MED ORDER — SODIUM CHLORIDE 0.9 % IV BOLUS (SEPSIS)
1000.0000 mL | Freq: Once | INTRAVENOUS | Status: AC
  Administered 2017-11-01: 1000 mL via INTRAVENOUS

## 2017-11-01 NOTE — ED Provider Notes (Signed)
TIME SEEN: 12:28 AM  CHIEF COMPLAINT: Left-sided flank pain  HPI: Patient is a 32 year old female with history of kidney stone at 32 years old that did not require surgical intervention who presents emergency department with left-sided flank pain that started around 8 PM tonight.  States pain radiated into her groin and felt similar to when she had previous kidney stone.  She had nausea but no vomiting.  No diarrhea.  No dysuria or hematuria.  No vaginal bleeding or discharge.  Last menstrual period was August 20.  She has had 2 previous C-sections.  No aggravating or alleviating factors.  Pain described as severe but has improved without intervention.  ROS: See HPI Constitutional: no fever  Eyes: no drainage  ENT: no runny nose   Cardiovascular:  no chest pain  Resp: no SOB  GI: no vomiting GU: no dysuria Integumentary: no rash  Allergy: no hives  Musculoskeletal: no leg swelling  Neurological: no slurred speech ROS otherwise negative  PAST MEDICAL HISTORY/PAST SURGICAL HISTORY:  Past Medical History:  Diagnosis Date  . Kidney stones   . Medical history non-contributory     MEDICATIONS:  Prior to Admission medications   Medication Sig Start Date End Date Taking? Authorizing Provider  acetaminophen (TYLENOL) 500 MG tablet Take 1,000 mg by mouth every 6 (six) hours as needed (for pain.).   Yes [provider]  ibuprofen (ADVIL,MOTRIN) 600 MG tablet Take 1 tablet (600 mg total) by mouth every 6 (six) hours as needed. Patient not taking: Reported on 10/31/2017 08/11/17   Edwinna Areola, DO    ALLERGIES:  No Known Allergies  SOCIAL HISTORY:  Social History   Tobacco Use  . Smoking status: Current Every Day Smoker    Packs/day: 0.25  . Smokeless tobacco: Never Used  Substance Use Topics  . Alcohol use: Not Currently    FAMILY HISTORY: Family History  Problem Relation Age of Onset  . Diabetes Father     EXAM: BP 109/66 (BP Location: Left Arm)   Pulse  71   Temp 98.1 F (36.7 C)   Resp 16   Ht 5\' 5"  (1.651 m)   Wt 66.7 kg   LMP 10/10/2016 (Approximate)   SpO2 100%   Breastfeeding? No   BMI 24.46 kg/m  CONSTITUTIONAL: Alert and oriented and responds appropriately to questions. Well-appearing; well-nourished HEAD: Normocephalic EYES: Conjunctivae clear, pupils appear equal, EOMI ENT: normal nose; moist mucous membranes NECK: Supple, no meningismus, no nuchal rigidity, no LAD  CARD: RRR; S1 and S2 appreciated; no murmurs, no clicks, no rubs, no gallops RESP: Normal chest excursion without splinting or tachypnea; breath sounds clear and equal bilaterally; no wheezes, no rhonchi, no rales, no hypoxia or respiratory distress, speaking full sentences ABD/GI: Normal bowel sounds; non-distended; soft, non-tender, no rebound, no guarding, no peritoneal signs, no hepatosplenomegaly BACK:  The back appears normal and is non-tender to palpation, there is no CVA tenderness EXT: Normal ROM in all joints; non-tender to palpation; no edema; normal capillary refill; no cyanosis, no calf tenderness or swelling    SKIN: Normal color for age and race; warm; no rash NEURO: Moves all extremities equally PSYCH: The patient's mood and manner are appropriate. Grooming and personal hygiene are appropriate.  MEDICAL DECISION MAKING: Patient here with left-sided flank pain.  Likely from kidney stone.  Urine shows large blood but no sign of infection.  She is not pregnant.  We have discussed risk and benefits today of CT imaging but given she has  had documented history of stones and that this feels similar and there is no sign of infected stone and her pain currently seems well controlled we have decided we will hold off on CT imaging at this time to reduce radiation exposure.  She will follow-up with urology as an outpatient.  Will give Toradol, Zofran and reassess.  Anticipate discharge home with pain and nausea medicine, urine strainer.  ED PROGRESS: Patient  reports pain now gone after Toradol.  She states she is feeling much better.  Plan to discharge home with outpatient urology follow-up and prescriptions for ibuprofen, Vicodin, Zofran.  Patient comfortable with this plan.  Discussed return precautions.   At this time, I do not feel there is any life-threatening condition present. I have reviewed and discussed all results (EKG, imaging, lab, urine as appropriate) and exam findings with patient/family. I have reviewed nursing notes and appropriate previous records.  I feel the patient is safe to be discharged home without further emergent workup and can continue workup as an outpatient as needed. Discussed usual and customary return precautions. Patient/family verbalize understanding and are comfortable with this plan.  Outpatient follow-up has been provided if needed. All questions have been answered.      Wanda Rideout, Layla Maw, DO 11/01/17 [phone]

## 2017-11-01 NOTE — ED Notes (Signed)
ED Provider at bedside. 

## 2017-11-01 NOTE — ED Notes (Signed)
Provided Pt with strainer before departure

## 2018-11-15 DIAGNOSIS — M545 Low back pain: Secondary | ICD-10-CM | POA: Diagnosis not present

## 2018-11-15 DIAGNOSIS — N939 Abnormal uterine and vaginal bleeding, unspecified: Secondary | ICD-10-CM | POA: Diagnosis not present

## 2018-11-16 DIAGNOSIS — M545 Low back pain: Secondary | ICD-10-CM | POA: Diagnosis not present

## 2018-11-20 ENCOUNTER — Other Ambulatory Visit (HOSPITAL_BASED_OUTPATIENT_CLINIC_OR_DEPARTMENT_OTHER): Payer: Self-pay | Admitting: Physician Assistant

## 2018-11-20 DIAGNOSIS — M545 Low back pain, unspecified: Secondary | ICD-10-CM

## 2018-11-20 DIAGNOSIS — N939 Abnormal uterine and vaginal bleeding, unspecified: Secondary | ICD-10-CM

## 2018-11-21 ENCOUNTER — Ambulatory Visit (HOSPITAL_BASED_OUTPATIENT_CLINIC_OR_DEPARTMENT_OTHER)
Admission: RE | Admit: 2018-11-21 | Discharge: 2018-11-21 | Disposition: A | Discharge status: Home / Self Care | Payer: BC Managed Care – PPO | Source: Ambulatory Visit | Attending: Physician Assistant | Admitting: Physician Assistant

## 2018-11-21 ENCOUNTER — Other Ambulatory Visit: Payer: Self-pay

## 2018-11-21 DIAGNOSIS — M545 Low back pain, unspecified: Secondary | ICD-10-CM

## 2018-11-21 DIAGNOSIS — N939 Abnormal uterine and vaginal bleeding, unspecified: Secondary | ICD-10-CM

## 2018-11-21 DIAGNOSIS — N83292 Other ovarian cyst, left side: Secondary | ICD-10-CM | POA: Diagnosis not present

## 2018-11-21 DIAGNOSIS — N83291 Other ovarian cyst, right side: Secondary | ICD-10-CM | POA: Diagnosis not present

## 2018-12-10 DIAGNOSIS — N920 Excessive and frequent menstruation with regular cycle: Secondary | ICD-10-CM | POA: Diagnosis not present

## 2018-12-20 DIAGNOSIS — N926 Irregular menstruation, unspecified: Secondary | ICD-10-CM | POA: Diagnosis not present

## 2018-12-20 DIAGNOSIS — Z01411 Encounter for gynecological examination (general) (routine) with abnormal findings: Secondary | ICD-10-CM | POA: Diagnosis not present

## 2019-03-07 DIAGNOSIS — Z23 Encounter for immunization: Secondary | ICD-10-CM | POA: Diagnosis not present

## 2019-03-07 DIAGNOSIS — N911 Secondary amenorrhea: Secondary | ICD-10-CM | POA: Diagnosis not present

## 2019-03-07 DIAGNOSIS — Z3201 Encounter for pregnancy test, result positive: Secondary | ICD-10-CM | POA: Diagnosis not present

## 2019-03-07 DIAGNOSIS — Z3A01 Less than 8 weeks gestation of pregnancy: Secondary | ICD-10-CM | POA: Diagnosis not present

## 2019-03-07 DIAGNOSIS — O3680X0 Pregnancy with inconclusive fetal viability, not applicable or unspecified: Secondary | ICD-10-CM | POA: Diagnosis not present

## 2019-03-20 DIAGNOSIS — F339 Major depressive disorder, recurrent, unspecified: Secondary | ICD-10-CM | POA: Diagnosis not present

## 2019-03-20 DIAGNOSIS — F1020 Alcohol dependence, uncomplicated: Secondary | ICD-10-CM | POA: Diagnosis not present

## 2019-03-20 DIAGNOSIS — F1220 Cannabis dependence, uncomplicated: Secondary | ICD-10-CM | POA: Diagnosis not present

## 2019-03-20 DIAGNOSIS — F1520 Other stimulant dependence, uncomplicated: Secondary | ICD-10-CM | POA: Diagnosis not present

## 2019-03-21 DIAGNOSIS — F1220 Cannabis dependence, uncomplicated: Secondary | ICD-10-CM | POA: Diagnosis not present

## 2019-03-21 DIAGNOSIS — F339 Major depressive disorder, recurrent, unspecified: Secondary | ICD-10-CM | POA: Diagnosis not present

## 2019-03-21 DIAGNOSIS — F1520 Other stimulant dependence, uncomplicated: Secondary | ICD-10-CM | POA: Diagnosis not present

## 2019-03-21 DIAGNOSIS — F1020 Alcohol dependence, uncomplicated: Secondary | ICD-10-CM | POA: Diagnosis not present

## 2019-03-22 DIAGNOSIS — F1020 Alcohol dependence, uncomplicated: Secondary | ICD-10-CM | POA: Diagnosis not present

## 2019-03-22 DIAGNOSIS — F1220 Cannabis dependence, uncomplicated: Secondary | ICD-10-CM | POA: Diagnosis not present

## 2019-03-22 DIAGNOSIS — F339 Major depressive disorder, recurrent, unspecified: Secondary | ICD-10-CM | POA: Diagnosis not present

## 2019-03-22 DIAGNOSIS — F1520 Other stimulant dependence, uncomplicated: Secondary | ICD-10-CM | POA: Diagnosis not present

## 2019-03-26 DIAGNOSIS — F3189 Other bipolar disorder: Secondary | ICD-10-CM | POA: Diagnosis not present

## 2019-03-26 DIAGNOSIS — F1020 Alcohol dependence, uncomplicated: Secondary | ICD-10-CM | POA: Diagnosis not present

## 2019-03-26 DIAGNOSIS — F17200 Nicotine dependence, unspecified, uncomplicated: Secondary | ICD-10-CM | POA: Diagnosis not present

## 2019-03-27 DIAGNOSIS — F3189 Other bipolar disorder: Secondary | ICD-10-CM | POA: Diagnosis not present

## 2019-04-04 DIAGNOSIS — Z3689 Encounter for other specified antenatal screening: Secondary | ICD-10-CM | POA: Diagnosis not present

## 2019-04-04 DIAGNOSIS — Z3481 Encounter for supervision of other normal pregnancy, first trimester: Secondary | ICD-10-CM | POA: Diagnosis not present

## 2019-04-04 DIAGNOSIS — Z3A10 10 weeks gestation of pregnancy: Secondary | ICD-10-CM | POA: Diagnosis not present

## 2019-04-04 DIAGNOSIS — Z3A09 9 weeks gestation of pregnancy: Secondary | ICD-10-CM | POA: Diagnosis not present

## 2019-04-04 DIAGNOSIS — O26891 Other specified pregnancy related conditions, first trimester: Secondary | ICD-10-CM | POA: Diagnosis not present

## 2019-04-04 DIAGNOSIS — Z113 Encounter for screening for infections with a predominantly sexual mode of transmission: Secondary | ICD-10-CM | POA: Diagnosis not present

## 2019-04-05 DIAGNOSIS — Z1151 Encounter for screening for human papillomavirus (HPV): Secondary | ICD-10-CM | POA: Diagnosis not present

## 2019-04-05 LAB — RESULTS CONSOLE HPV: CHL HPV: POSITIVE

## 2019-04-05 LAB — HM PAP SMEAR: HM Pap smear: NEGATIVE

## 2019-04-15 DIAGNOSIS — O200 Threatened abortion: Secondary | ICD-10-CM | POA: Diagnosis not present

## 2019-04-15 DIAGNOSIS — Z3A11 11 weeks gestation of pregnancy: Secondary | ICD-10-CM | POA: Diagnosis not present

## 2019-04-23 DIAGNOSIS — F17200 Nicotine dependence, unspecified, uncomplicated: Secondary | ICD-10-CM | POA: Diagnosis not present

## 2019-04-23 DIAGNOSIS — F1020 Alcohol dependence, uncomplicated: Secondary | ICD-10-CM | POA: Diagnosis not present

## 2019-04-23 DIAGNOSIS — F3189 Other bipolar disorder: Secondary | ICD-10-CM | POA: Diagnosis not present

## 2019-06-04 DIAGNOSIS — F17200 Nicotine dependence, unspecified, uncomplicated: Secondary | ICD-10-CM | POA: Diagnosis not present

## 2019-06-04 DIAGNOSIS — F3189 Other bipolar disorder: Secondary | ICD-10-CM | POA: Diagnosis not present

## 2019-06-18 DIAGNOSIS — Z363 Encounter for antenatal screening for malformations: Secondary | ICD-10-CM | POA: Diagnosis not present

## 2019-06-18 DIAGNOSIS — Z3A20 20 weeks gestation of pregnancy: Secondary | ICD-10-CM | POA: Diagnosis not present

## 2019-07-16 DIAGNOSIS — F3189 Other bipolar disorder: Secondary | ICD-10-CM | POA: Diagnosis not present

## 2019-07-17 DIAGNOSIS — Z3A24 24 weeks gestation of pregnancy: Secondary | ICD-10-CM | POA: Diagnosis not present

## 2019-07-17 DIAGNOSIS — O4692 Antepartum hemorrhage, unspecified, second trimester: Secondary | ICD-10-CM | POA: Diagnosis not present

## 2019-08-15 DIAGNOSIS — Z3689 Encounter for other specified antenatal screening: Secondary | ICD-10-CM | POA: Diagnosis not present

## 2019-08-15 DIAGNOSIS — Z23 Encounter for immunization: Secondary | ICD-10-CM | POA: Diagnosis not present

## 2019-08-21 DIAGNOSIS — F3189 Other bipolar disorder: Secondary | ICD-10-CM | POA: Diagnosis not present

## 2019-08-29 DIAGNOSIS — O99810 Abnormal glucose complicating pregnancy: Secondary | ICD-10-CM | POA: Diagnosis not present

## 2019-09-02 DIAGNOSIS — Z3A31 31 weeks gestation of pregnancy: Secondary | ICD-10-CM | POA: Diagnosis not present

## 2019-09-02 DIAGNOSIS — O365933 Maternal care for other known or suspected poor fetal growth, third trimester, fetus 3: Secondary | ICD-10-CM | POA: Diagnosis not present

## 2019-09-09 DIAGNOSIS — Z3A32 32 weeks gestation of pregnancy: Secondary | ICD-10-CM | POA: Diagnosis not present

## 2019-09-09 DIAGNOSIS — R898 Other abnormal findings in specimens from other organs, systems and tissues: Secondary | ICD-10-CM | POA: Diagnosis not present

## 2019-09-12 DIAGNOSIS — Z3A32 32 weeks gestation of pregnancy: Secondary | ICD-10-CM | POA: Diagnosis not present

## 2019-09-12 DIAGNOSIS — O09893 Supervision of other high risk pregnancies, third trimester: Secondary | ICD-10-CM | POA: Diagnosis not present

## 2019-09-16 DIAGNOSIS — O403XX1 Polyhydramnios, third trimester, fetus 1: Secondary | ICD-10-CM | POA: Diagnosis not present

## 2019-09-19 DIAGNOSIS — O24415 Gestational diabetes mellitus in pregnancy, controlled by oral hypoglycemic drugs: Secondary | ICD-10-CM | POA: Diagnosis not present

## 2019-09-19 DIAGNOSIS — Z3A33 33 weeks gestation of pregnancy: Secondary | ICD-10-CM | POA: Diagnosis not present

## 2019-09-23 DIAGNOSIS — Z3A34 34 weeks gestation of pregnancy: Secondary | ICD-10-CM | POA: Diagnosis not present

## 2019-09-23 DIAGNOSIS — O09893 Supervision of other high risk pregnancies, third trimester: Secondary | ICD-10-CM | POA: Diagnosis not present

## 2019-09-26 DIAGNOSIS — Z3A34 34 weeks gestation of pregnancy: Secondary | ICD-10-CM | POA: Diagnosis not present

## 2019-09-26 DIAGNOSIS — O4193X3 Disorder of amniotic fluid and membranes, unspecified, third trimester, fetus 3: Secondary | ICD-10-CM | POA: Diagnosis not present

## 2019-10-03 DIAGNOSIS — O365933 Maternal care for other known or suspected poor fetal growth, third trimester, fetus 3: Secondary | ICD-10-CM | POA: Diagnosis not present

## 2019-10-03 DIAGNOSIS — Z3A35 35 weeks gestation of pregnancy: Secondary | ICD-10-CM | POA: Diagnosis not present

## 2019-10-10 DIAGNOSIS — Z3A36 36 weeks gestation of pregnancy: Secondary | ICD-10-CM | POA: Diagnosis not present

## 2019-10-10 DIAGNOSIS — Z3685 Encounter for antenatal screening for Streptococcus B: Secondary | ICD-10-CM | POA: Diagnosis not present

## 2019-10-10 DIAGNOSIS — O4393 Unspecified placental disorder, third trimester: Secondary | ICD-10-CM | POA: Diagnosis not present

## 2019-10-10 LAB — OB RESULTS CONSOLE GBS: GBS: NEGATIVE

## 2019-10-14 DIAGNOSIS — O4393 Unspecified placental disorder, third trimester: Secondary | ICD-10-CM | POA: Diagnosis not present

## 2019-10-14 DIAGNOSIS — Z3A37 37 weeks gestation of pregnancy: Secondary | ICD-10-CM | POA: Diagnosis not present

## 2019-10-17 DIAGNOSIS — O283 Abnormal ultrasonic finding on antenatal screening of mother: Secondary | ICD-10-CM | POA: Diagnosis not present

## 2019-10-18 ENCOUNTER — Inpatient Hospital Stay (HOSPITAL_COMMUNITY): Payer: BC Managed Care – PPO | Admitting: Anesthesiology

## 2019-10-18 ENCOUNTER — Encounter (HOSPITAL_COMMUNITY): Payer: Self-pay | Admitting: Obstetrics and Gynecology

## 2019-10-18 ENCOUNTER — Encounter (HOSPITAL_COMMUNITY)
Admission: AD | Disposition: A | Discharge status: Home / Self Care | Payer: Self-pay | Source: Home / Self Care | Attending: Obstetrics and Gynecology

## 2019-10-18 ENCOUNTER — Inpatient Hospital Stay (HOSPITAL_COMMUNITY)
Admission: AD | Admit: 2019-10-18 | Discharge: 2019-10-19 | DRG: 788 | Disposition: A | Discharge status: Home / Self Care | Payer: BC Managed Care – PPO | Attending: Obstetrics and Gynecology | Admitting: Obstetrics and Gynecology

## 2019-10-18 ENCOUNTER — Other Ambulatory Visit: Payer: Self-pay

## 2019-10-18 DIAGNOSIS — Z3A38 38 weeks gestation of pregnancy: Secondary | ICD-10-CM | POA: Diagnosis not present

## 2019-10-18 DIAGNOSIS — O4290 Premature rupture of membranes, unspecified as to length of time between rupture and onset of labor, unspecified weeks of gestation: Secondary | ICD-10-CM

## 2019-10-18 DIAGNOSIS — F17210 Nicotine dependence, cigarettes, uncomplicated: Secondary | ICD-10-CM | POA: Diagnosis not present

## 2019-10-18 DIAGNOSIS — Z3A37 37 weeks gestation of pregnancy: Secondary | ICD-10-CM

## 2019-10-18 DIAGNOSIS — O99334 Smoking (tobacco) complicating childbirth: Secondary | ICD-10-CM | POA: Diagnosis not present

## 2019-10-18 DIAGNOSIS — Z23 Encounter for immunization: Secondary | ICD-10-CM | POA: Diagnosis not present

## 2019-10-18 DIAGNOSIS — O34211 Maternal care for low transverse scar from previous cesarean delivery: Secondary | ICD-10-CM | POA: Diagnosis not present

## 2019-10-18 DIAGNOSIS — O99344 Other mental disorders complicating childbirth: Secondary | ICD-10-CM | POA: Diagnosis not present

## 2019-10-18 DIAGNOSIS — O9081 Anemia of the puerperium: Secondary | ICD-10-CM | POA: Diagnosis present

## 2019-10-18 DIAGNOSIS — O4292 Full-term premature rupture of membranes, unspecified as to length of time between rupture and onset of labor: Secondary | ICD-10-CM | POA: Diagnosis not present

## 2019-10-18 DIAGNOSIS — Z3689 Encounter for other specified antenatal screening: Secondary | ICD-10-CM

## 2019-10-18 DIAGNOSIS — F419 Anxiety disorder, unspecified: Secondary | ICD-10-CM | POA: Diagnosis not present

## 2019-10-18 DIAGNOSIS — Z98891 History of uterine scar from previous surgery: Secondary | ICD-10-CM | POA: Diagnosis not present

## 2019-10-18 DIAGNOSIS — D509 Iron deficiency anemia, unspecified: Secondary | ICD-10-CM | POA: Diagnosis present

## 2019-10-18 DIAGNOSIS — Z20822 Contact with and (suspected) exposure to covid-19: Secondary | ICD-10-CM | POA: Diagnosis not present

## 2019-10-18 DIAGNOSIS — F329 Major depressive disorder, single episode, unspecified: Secondary | ICD-10-CM | POA: Diagnosis not present

## 2019-10-18 HISTORY — DX: History of uterine scar from previous surgery: Z98.891

## 2019-10-18 LAB — CBC
HCT: 30.8 % — ABNORMAL LOW (ref 36.0–46.0)
Hemoglobin: 10.3 g/dL — ABNORMAL LOW (ref 12.0–15.0)
MCH: 30.7 pg (ref 26.0–34.0)
MCHC: 33.4 g/dL (ref 30.0–36.0)
MCV: 91.9 fL (ref 80.0–100.0)
Platelets: 283 10*3/uL (ref 150–400)
RBC: 3.35 MIL/uL — ABNORMAL LOW (ref 3.87–5.11)
RDW: 13 % (ref 11.5–15.5)
WBC: 10.4 10*3/uL (ref 4.0–10.5)
nRBC: 0 % (ref 0.0–0.2)

## 2019-10-18 LAB — POCT FERN TEST
POCT Fern Test: POSITIVE
POCT Fern Test: POSITIVE

## 2019-10-18 LAB — SARS CORONAVIRUS 2 BY RT PCR (HOSPITAL ORDER, PERFORMED IN ~~LOC~~ HOSPITAL LAB): SARS Coronavirus 2: NEGATIVE

## 2019-10-18 LAB — TYPE AND SCREEN
ABO/RH(D): A POS
Antibody Screen: NEGATIVE

## 2019-10-18 SURGERY — Surgical Case
Anesthesia: Spinal

## 2019-10-18 MED ORDER — NALOXONE HCL 0.4 MG/ML IJ SOLN: 0.4000 mg | INTRAMUSCULAR | Status: DC | PRN

## 2019-10-18 MED ORDER — KETOROLAC TROMETHAMINE 30 MG/ML IJ SOLN
30.0000 mg | Freq: Four times a day (QID) | INTRAMUSCULAR | Status: AC | PRN
  Administered 2019-10-19: 30 mg via INTRAVENOUS
  Filled 2019-10-18: qty 1

## 2019-10-18 MED ORDER — OXYCODONE HCL 5 MG PO TABS
10.0000 mg | ORAL_TABLET | ORAL | Status: DC | PRN
  Administered 2019-10-19: 10 mg via ORAL
  Filled 2019-10-18: qty 2

## 2019-10-18 MED ORDER — NALOXONE HCL 4 MG/10ML IJ SOLN
1.0000 ug/kg/h | INTRAVENOUS | Status: DC | PRN
  Filled 2019-10-18: qty 5

## 2019-10-18 MED ORDER — NALBUPHINE HCL 10 MG/ML IJ SOLN: 5.0000 mg | Freq: Once | INTRAMUSCULAR | Status: DC | PRN

## 2019-10-18 MED ORDER — OXYTOCIN-SODIUM CHLORIDE 30-0.9 UT/500ML-% IV SOLN: 2.5000 [IU]/h | INTRAVENOUS | Status: AC

## 2019-10-18 MED ORDER — SCOPOLAMINE 1 MG/3DAYS TD PT72
MEDICATED_PATCH | TRANSDERMAL | Status: DC | PRN
  Administered 2019-10-18: 1 via TRANSDERMAL

## 2019-10-18 MED ORDER — LACTATED RINGERS IV BOLUS
1000.0000 mL | Freq: Once | INTRAVENOUS | Status: AC
  Administered 2019-10-18: 1000 mL via INTRAVENOUS

## 2019-10-18 MED ORDER — OXYCODONE HCL 5 MG PO TABS: 5.0000 mg | ORAL_TABLET | ORAL | Status: DC | PRN

## 2019-10-18 MED ORDER — SENNOSIDES-DOCUSATE SODIUM 8.6-50 MG PO TABS
2.0000 | ORAL_TABLET | ORAL | Status: DC
  Administered 2019-10-18: 2 via ORAL
  Filled 2019-10-18: qty 2

## 2019-10-18 MED ORDER — FAMOTIDINE IN NACL 20-0.9 MG/50ML-% IV SOLN
20.0000 mg | Freq: Once | INTRAVENOUS | Status: AC
  Administered 2019-10-18: 20 mg via INTRAVENOUS
  Filled 2019-10-18: qty 50

## 2019-10-18 MED ORDER — NALBUPHINE HCL 10 MG/ML IJ SOLN: 5.0000 mg | INTRAMUSCULAR | Status: DC | PRN

## 2019-10-18 MED ORDER — DEXMEDETOMIDINE (PRECEDEX) IN NS 20 MCG/5ML (4 MCG/ML) IV SYRINGE
PREFILLED_SYRINGE | INTRAVENOUS | Status: AC
  Filled 2019-10-18: qty 5

## 2019-10-18 MED ORDER — MEPERIDINE HCL 25 MG/ML IJ SOLN: 6.2500 mg | INTRAMUSCULAR | Status: DC | PRN

## 2019-10-18 MED ORDER — FENTANYL CITRATE (PF) 100 MCG/2ML IJ SOLN
INTRAMUSCULAR | Status: AC
  Filled 2019-10-18: qty 2

## 2019-10-18 MED ORDER — KETOROLAC TROMETHAMINE 30 MG/ML IJ SOLN
30.0000 mg | Freq: Four times a day (QID) | INTRAMUSCULAR | Status: AC | PRN
  Administered 2019-10-18: 30 mg via INTRAMUSCULAR

## 2019-10-18 MED ORDER — MIDAZOLAM HCL 2 MG/2ML IJ SOLN
INTRAMUSCULAR | Status: AC
  Filled 2019-10-18: qty 2

## 2019-10-18 MED ORDER — SODIUM CHLORIDE 0.9 % IR SOLN
Status: DC | PRN
  Administered 2019-10-18: 1

## 2019-10-18 MED ORDER — PHENYLEPHRINE 40 MCG/ML (10ML) SYRINGE FOR IV PUSH (FOR BLOOD PRESSURE SUPPORT)
PREFILLED_SYRINGE | INTRAVENOUS | Status: AC
  Filled 2019-10-18: qty 10

## 2019-10-18 MED ORDER — OXYTOCIN-SODIUM CHLORIDE 30-0.9 UT/500ML-% IV SOLN
INTRAVENOUS | Status: AC
  Filled 2019-10-18: qty 500

## 2019-10-18 MED ORDER — ONDANSETRON HCL 4 MG/2ML IJ SOLN: 4.0000 mg | Freq: Three times a day (TID) | INTRAMUSCULAR | Status: DC | PRN

## 2019-10-18 MED ORDER — DIPHENHYDRAMINE HCL 25 MG PO CAPS: 25.0000 mg | ORAL_CAPSULE | ORAL | Status: DC | PRN

## 2019-10-18 MED ORDER — METOCLOPRAMIDE HCL 5 MG/ML IJ SOLN
INTRAMUSCULAR | Status: AC
  Filled 2019-10-18: qty 2

## 2019-10-18 MED ORDER — SIMETHICONE 80 MG PO CHEW
80.0000 mg | CHEWABLE_TABLET | ORAL | Status: DC
  Administered 2019-10-18: 80 mg via ORAL
  Filled 2019-10-18: qty 1

## 2019-10-18 MED ORDER — STERILE WATER FOR IRRIGATION IR SOLN
Status: DC | PRN
  Administered 2019-10-18: 1000 mL

## 2019-10-18 MED ORDER — DIPHENHYDRAMINE HCL 50 MG/ML IJ SOLN: 12.5000 mg | INTRAMUSCULAR | Status: DC | PRN

## 2019-10-18 MED ORDER — ONDANSETRON HCL 4 MG/2ML IJ SOLN
INTRAMUSCULAR | Status: DC | PRN
  Administered 2019-10-18: 4 mg via INTRAVENOUS

## 2019-10-18 MED ORDER — PRENATAL MULTIVITAMIN CH
1.0000 | ORAL_TABLET | Freq: Every day | ORAL | Status: DC
  Filled 2019-10-18: qty 1

## 2019-10-18 MED ORDER — OXYTOCIN-SODIUM CHLORIDE 30-0.9 UT/500ML-% IV SOLN
INTRAVENOUS | Status: DC | PRN
  Administered 2019-10-18: 30 mL via INTRAVENOUS

## 2019-10-18 MED ORDER — SIMETHICONE 80 MG PO CHEW
80.0000 mg | CHEWABLE_TABLET | Freq: Three times a day (TID) | ORAL | Status: DC
  Administered 2019-10-19 (×3): 80 mg via ORAL
  Filled 2019-10-18 (×2): qty 1

## 2019-10-18 MED ORDER — CEFAZOLIN SODIUM-DEXTROSE 2-3 GM-%(50ML) IV SOLR
INTRAVENOUS | Status: DC | PRN
  Administered 2019-10-18: 2 g via INTRAVENOUS

## 2019-10-18 MED ORDER — ONDANSETRON HCL 4 MG/2ML IJ SOLN
INTRAMUSCULAR | Status: AC
  Filled 2019-10-18: qty 2

## 2019-10-18 MED ORDER — MENTHOL 3 MG MT LOZG: 1.0000 | LOZENGE | OROMUCOSAL | Status: DC | PRN

## 2019-10-18 MED ORDER — SODIUM CHLORIDE 0.9% FLUSH: 3.0000 mL | INTRAVENOUS | Status: DC | PRN

## 2019-10-18 MED ORDER — MORPHINE SULFATE (PF) 0.5 MG/ML IJ SOLN
INTRAMUSCULAR | Status: AC
  Filled 2019-10-18: qty 10

## 2019-10-18 MED ORDER — PHENYLEPHRINE HCL-NACL 20-0.9 MG/250ML-% IV SOLN
INTRAVENOUS | Status: DC | PRN
  Administered 2019-10-18: 60 ug/min via INTRAVENOUS

## 2019-10-18 MED ORDER — SCOPOLAMINE 1 MG/3DAYS TD PT72: 1.0000 | MEDICATED_PATCH | TRANSDERMAL | Status: DC

## 2019-10-18 MED ORDER — LACTATED RINGERS IV SOLN: INTRAVENOUS | Status: DC

## 2019-10-18 MED ORDER — ZOLPIDEM TARTRATE 5 MG PO TABS: 5.0000 mg | ORAL_TABLET | Freq: Every evening | ORAL | Status: DC | PRN

## 2019-10-18 MED ORDER — PHENYLEPHRINE HCL-NACL 20-0.9 MG/250ML-% IV SOLN
INTRAVENOUS | Status: AC
  Filled 2019-10-18: qty 250

## 2019-10-18 MED ORDER — DIBUCAINE (PERIANAL) 1 % EX OINT: 1.0000 "application " | TOPICAL_OINTMENT | CUTANEOUS | Status: DC | PRN

## 2019-10-18 MED ORDER — COCONUT OIL OIL: 1.0000 "application " | TOPICAL_OIL | Status: DC | PRN

## 2019-10-18 MED ORDER — DEXAMETHASONE SODIUM PHOSPHATE 4 MG/ML IJ SOLN
INTRAMUSCULAR | Status: AC
  Filled 2019-10-18: qty 2

## 2019-10-18 MED ORDER — MORPHINE SULFATE (PF) 0.5 MG/ML IJ SOLN
INTRAMUSCULAR | Status: DC | PRN
Start: 2019-10-18 — End: 2019-10-18
  Administered 2019-10-18: .15 mg via INTRATHECAL

## 2019-10-18 MED ORDER — DIPHENHYDRAMINE HCL 25 MG PO CAPS: 25.0000 mg | ORAL_CAPSULE | Freq: Four times a day (QID) | ORAL | Status: DC | PRN

## 2019-10-18 MED ORDER — LACTATED RINGERS IV SOLN: INTRAVENOUS | Status: DC | PRN

## 2019-10-18 MED ORDER — SIMETHICONE 80 MG PO CHEW: 80.0000 mg | CHEWABLE_TABLET | ORAL | Status: DC | PRN

## 2019-10-18 MED ORDER — WITCH HAZEL-GLYCERIN EX PADS: 1.0000 "application " | MEDICATED_PAD | CUTANEOUS | Status: DC | PRN

## 2019-10-18 MED ORDER — FENTANYL CITRATE (PF) 100 MCG/2ML IJ SOLN: 25.0000 ug | INTRAMUSCULAR | Status: DC | PRN

## 2019-10-18 MED ORDER — BUPIVACAINE IN DEXTROSE 0.75-8.25 % IT SOLN
INTRATHECAL | Status: DC | PRN
  Administered 2019-10-18: 1.7 mL via INTRATHECAL

## 2019-10-18 MED ORDER — TETANUS-DIPHTH-ACELL PERTUSSIS 5-2.5-18.5 LF-MCG/0.5 IM SUSP: 0.5000 mL | Freq: Once | INTRAMUSCULAR | Status: DC

## 2019-10-18 MED ORDER — SOD CITRATE-CITRIC ACID 500-334 MG/5ML PO SOLN
30.0000 mL | Freq: Once | ORAL | Status: AC
  Administered 2019-10-18: 30 mL via ORAL
  Filled 2019-10-18: qty 30

## 2019-10-18 MED ORDER — KETOROLAC TROMETHAMINE 30 MG/ML IJ SOLN
INTRAMUSCULAR | Status: AC
  Filled 2019-10-18: qty 1

## 2019-10-18 MED ORDER — CEFAZOLIN SODIUM-DEXTROSE 2-4 GM/100ML-% IV SOLN
2.0000 g | INTRAVENOUS | Status: DC
  Filled 2019-10-18: qty 100

## 2019-10-18 MED ORDER — MIDAZOLAM HCL 2 MG/2ML IJ SOLN
INTRAMUSCULAR | Status: DC | PRN
  Administered 2019-10-18: 1 mg via INTRAVENOUS

## 2019-10-18 MED ORDER — FENTANYL CITRATE (PF) 100 MCG/2ML IJ SOLN
INTRAMUSCULAR | Status: DC | PRN
Start: 2019-10-18 — End: 2019-10-18
  Administered 2019-10-18: 15 ug via INTRATHECAL

## 2019-10-18 MED ORDER — ACETAMINOPHEN 500 MG PO TABS
1000.0000 mg | ORAL_TABLET | Freq: Four times a day (QID) | ORAL | Status: DC | PRN
  Administered 2019-10-18 – 2019-10-19 (×2): 1000 mg via ORAL
  Filled 2019-10-18 (×2): qty 2

## 2019-10-18 MED ORDER — SODIUM CHLORIDE 0.9 % IV SOLN: INTRAVENOUS | Status: DC | PRN

## 2019-10-18 MED ORDER — METOCLOPRAMIDE HCL 5 MG/ML IJ SOLN
INTRAMUSCULAR | Status: DC | PRN
  Administered 2019-10-18: 10 mg via INTRAVENOUS

## 2019-10-18 MED ORDER — DEXAMETHASONE SODIUM PHOSPHATE 4 MG/ML IJ SOLN
INTRAMUSCULAR | Status: DC | PRN
  Administered 2019-10-18: 4 mg via INTRAVENOUS

## 2019-10-18 SURGICAL SUPPLY — 41 items
BENZOIN TINCTURE PRP APPL 2/3 (GAUZE/BANDAGES/DRESSINGS) ×3 IMPLANT
CHLORAPREP W/TINT 26ML (MISCELLANEOUS) ×3 IMPLANT
CLAMP CORD UMBIL (MISCELLANEOUS) IMPLANT
CLOSURE WOUND 1/2 X4 (GAUZE/BANDAGES/DRESSINGS) ×1
CLOSURE WOUND 1/4X4 (GAUZE/BANDAGES/DRESSINGS) ×1
CLOTH BEACON ORANGE TIMEOUT ST (SAFETY) ×3 IMPLANT
DRSG OPSITE POSTOP 4X10 (GAUZE/BANDAGES/DRESSINGS) ×3 IMPLANT
ELECT REM PT RETURN 9FT ADLT (ELECTROSURGICAL) ×3
ELECTRODE REM PT RTRN 9FT ADLT (ELECTROSURGICAL) ×1 IMPLANT
EXTRACTOR VACUUM M CUP 4 TUBE (SUCTIONS) IMPLANT
EXTRACTOR VACUUM M CUP 4' TUBE (SUCTIONS)
GLOVE BIO SURGEON STRL SZ 6.5 (GLOVE) ×2 IMPLANT
GLOVE BIO SURGEONS STRL SZ 6.5 (GLOVE) ×1
GLOVE BIOGEL PI IND STRL 7.0 (GLOVE) ×1 IMPLANT
GLOVE BIOGEL PI INDICATOR 7.0 (GLOVE) ×2
GOWN STRL REUS W/TWL LRG LVL3 (GOWN DISPOSABLE) ×6 IMPLANT
HEMOSTAT ARISTA ABSORB 3G PWDR (HEMOSTASIS) ×3 IMPLANT
KIT ABG SYR 3ML LUER SLIP (SYRINGE) IMPLANT
NEEDLE HYPO 25X5/8 SAFETYGLIDE (NEEDLE) IMPLANT
NS IRRIG 1000ML POUR BTL (IV SOLUTION) ×3 IMPLANT
PACK C SECTION WH (CUSTOM PROCEDURE TRAY) ×3 IMPLANT
PAD OB MATERNITY 4.3X12.25 (PERSONAL CARE ITEMS) ×3 IMPLANT
PENCIL SMOKE EVAC W/HOLSTER (ELECTROSURGICAL) ×3 IMPLANT
RTRCTR C-SECT PINK 25CM LRG (MISCELLANEOUS) ×3 IMPLANT
STRIP CLOSURE SKIN 1/2X4 (GAUZE/BANDAGES/DRESSINGS) ×2 IMPLANT
STRIP CLOSURE SKIN 1/4X4 (GAUZE/BANDAGES/DRESSINGS) ×2 IMPLANT
SUT MNCRL 0 VIOLET CTX 36 (SUTURE) ×2 IMPLANT
SUT MONOCRYL 0 CTX 36 (SUTURE) ×4
SUT PLAIN 1 NONE 54 (SUTURE) IMPLANT
SUT PLAIN 2 0 XLH (SUTURE) ×3 IMPLANT
SUT VIC AB 0 CT1 27 (SUTURE) ×4
SUT VIC AB 0 CT1 27XBRD ANBCTR (SUTURE) ×2 IMPLANT
SUT VIC AB 2-0 CT1 27 (SUTURE) ×2
SUT VIC AB 2-0 CT1 TAPERPNT 27 (SUTURE) ×1 IMPLANT
SUT VIC AB 3-0 SH 27 (SUTURE) ×2
SUT VIC AB 3-0 SH 27X BRD (SUTURE) ×1 IMPLANT
SUT VIC AB 4-0 KS 27 (SUTURE) ×3 IMPLANT
SYR BULB IRRIGATION 50ML (SYRINGE) ×3 IMPLANT
TOWEL OR 17X24 6PK STRL BLUE (TOWEL DISPOSABLE) ×3 IMPLANT
TRAY FOLEY W/BAG SLVR 14FR LF (SET/KITS/TRAYS/PACK) ×3 IMPLANT
WATER STERILE IRR 1000ML POUR (IV SOLUTION) ×3 IMPLANT

## 2019-10-18 NOTE — Anesthesia Postprocedure Evaluation (Signed)
Anesthesia Post Note  Patient: Melissa Wiggins  Procedure(s) Performed: CESAREAN SECTION (N/A )     Patient location during evaluation: PACU Anesthesia Type: Spinal Level of consciousness: oriented and awake and alert Pain management: pain level controlled Vital Signs Assessment: post-procedure vital signs reviewed and stable Respiratory status: spontaneous breathing, respiratory function stable and nonlabored ventilation Cardiovascular status: blood pressure returned to baseline and stable Postop Assessment: no headache, no backache, no apparent nausea or vomiting, spinal receding and patient able to bend at knees Anesthetic complications: no   No complications documented.  Last Vitals:  Vitals:   10/18/19 2030 10/18/19 2045  BP: 103/65 99/68  Pulse: 82 76  Resp: (!) 26 (!) 28  Temp:    SpO2: 100% 100%    Last Pain:  Vitals:   10/18/19 1945  TempSrc: Oral   Pain Goal:    LLE Motor Response: Purposeful movement (10/18/19 2030) LLE Sensation: Tingling (10/18/19 2030) RLE Motor Response: Purposeful movement (10/18/19 2030) RLE Sensation: Tingling (10/18/19 2030)     Epidural/Spinal Function Cutaneous sensation: Tingles (10/18/19 2030), Patient able to flex knees: Yes (10/18/19 2030), Patient able to lift hips off bed: No (10/18/19 2030), Back pain beyond tenderness at insertion site: No (10/18/19 2030), Progressively worsening motor and/or sensory loss: No (10/18/19 2030), Bowel and/or bladder incontinence post epidural: No (10/18/19 2030)  Obelia Bonello A.

## 2019-10-18 NOTE — Anesthesia Procedure Notes (Signed)
Spinal  Patient location during procedure: OR Start time: 10/18/2019 6:33 PM End time: 10/18/2019 6:35 PM Staffing Performed: anesthesiologist  Anesthesiologist: Mal Amabile, MD Preanesthetic Checklist Completed: patient identified, IV checked, site marked, risks and benefits discussed, surgical consent, monitors and equipment checked, pre-op evaluation and timeout performed Spinal Block Patient position: sitting Prep: DuraPrep and site prepped and draped Patient monitoring: heart rate, cardiac monitor, continuous pulse ox and blood pressure Approach: midline Location: L3-4 Injection technique: single-shot Needle Needle type: Pencan  Needle gauge: 24 G Needle length: 9 cm Needle insertion depth: 5 cm Assessment Sensory level: T4 Additional Notes Patient tolerated procedure well. Adequate sensory level.

## 2019-10-18 NOTE — H&P (Signed)
Melissa Wiggins is a 34 y.o. female [phone] at 38+ with ROM.  D/W pt r/b/a and process of rLTCS.  Pt with h/o anxiety and depression on seroquel.  Pt declined genetic screening.  Received Tdap 08/15/19.  Had [facility] and abnormal amnion followed by serial [place].    OB History    Gravida  4   Para  2   Term  2   Preterm      AB  1   Living  2     SAB      TAB  1   Ectopic      Multiple  0   Live Births  2         G1 TAB G2 6/18 39wk LTCS breech 7#4 G3 6/20 39wk rLTCS 6#13 G4 present  +abn pap, last pap WNL, HR HPV + - repeat in 2022 No STD (HPV)  Past Medical History:  Diagnosis Date  . Kidney stones   . Medical history non-contributory    Past Surgical History:  Procedure Laterality Date  . CESAREAN SECTION    . CESAREAN SECTION N/A 08/10/2017   Procedure: REPEAT CESAREAN SECTION;  Surgeon: Edwinna Areola, DO;  Location: [facility];  Service: Obstetrics;  Laterality: N/A;  Heather, RNFA   Family History: family history includes Diabetes in her father. Social History:  reports that she has been smoking. She has been smoking about 0.25 packs per day. She has never used smokeless tobacco. She reports previous alcohol use. She reports that she does not use drugs.     Maternal Diabetes: No Genetic Screening: Declined Maternal Ultrasounds/Referrals: Normal Fetal Ultrasounds or other Referrals:  None Maternal Substance Abuse:  No Significant Maternal Medications:  Meds include: Other: Seroquel Significant Maternal Lab Results:  Group B Strep negative Other Comments:  None  Review of Systems  Constitutional: Negative.   HENT: Negative.   Eyes: Negative.   Respiratory: Negative.   Cardiovascular: Negative.   Gastrointestinal: Negative.   Genitourinary: Negative.   Musculoskeletal: Positive for back pain.  Neurological: Negative.   Psychiatric/Behavioral: Negative.    Maternal Medical History:  Reason for admission: Rupture of membranes.    Contractions: Frequency: irregular.    Fetal activity: Perceived fetal activity is normal.    Prenatal complications: Separated amnion - followed by [place]  Prenatal Complications - Diabetes: none.      Blood pressure 108/62, pulse 84, temperature 98.3 F (36.8 C), resp. rate 18, height 5\' 7"  (1.702 m), weight 65.3 kg. Maternal Exam:  Abdomen: Patient reports no abdominal tenderness. Surgical scars: low transverse.   Fundal height is appropriate for gestation.   Estimated fetal weight is 7#.   Fetal presentation: vertex  Introitus: Normal vulva. Normal vagina.    Physical Exam Constitutional:      Appearance: Normal appearance.  HENT:     Head: Normocephalic and atraumatic.  Cardiovascular:     Rate and Rhythm: Normal rate and regular rhythm.  Pulmonary:     Effort: Pulmonary effort is normal.     Breath sounds: Normal breath sounds.  Abdominal:     General: Bowel sounds are normal.     Palpations: Abdomen is soft.     Comments: GRAVID  Genitourinary:    General: Normal vulva.  Musculoskeletal:        General: Normal range of motion.     Cervical back: Normal range of motion.  Skin:    General: Skin is warm and dry.  Neurological:  General: No focal deficit present.     Mental Status: She is alert and oriented to person, place, and time.  Psychiatric:        Mood and Affect: Mood normal.        Behavior: Behavior normal.     Prenatal labs: ABO, Rh: --/--/A POS (08/27 1643) Antibody:negative Rubella:  immune RPR:   NR HBsAg:   neg HIV:   neg GBS: Negative/-- (08/19 0000)   Tdap 6/24 Declined genetic screening  Hgb 11.8/Plt 251/Ur Cx neg/Chl neg/GC neg/Varicella immune/TSH WNL/Hep C neg/glucola 141 - nl 3 hr GTT  [place] SCH [place] nl AFI, nl ant, post plac, amnion separated from gest sac, [facility] Serial [place], good growth  Assessment/Plan: 33yo [id number] at 38+ w ROM for rLTCS Ancef for prophylaxis D/w pt r/b/a of rLTCS Will proceed when Covid is returned, OR  ready  Adyn Hoes Bovard-Stuckert 10/18/2019, 5:45 PM

## 2019-10-18 NOTE — Transfer of Care (Signed)
Immediate Anesthesia Transfer of Care Note  Patient: Melissa Wiggins  Procedure(s) Performed: CESAREAN SECTION (N/A )  Patient Location: PACU  Anesthesia Type:Spinal  Level of Consciousness: awake, alert  and oriented  Airway & Oxygen Therapy: Patient Spontanous Breathing  Post-op Assessment: Report given to RN and Post -op Vital signs reviewed and stable  Post vital signs: Reviewed and stable  Last Vitals:  Vitals Value Taken Time  BP 99/61   Temp    Pulse 81   Resp 15   SpO2 100     Last Pain: There were no vitals filed for this visit.       Complications: No complications documented.

## 2019-10-18 NOTE — Brief Op Note (Signed)
10/18/2019  7:55 PM  PATIENT:  Melissa Wiggins  34 y.o. female  PRE-OPERATIVE DIAGNOSIS:  repeat ceserean section, h/o LTCS x 2, ROM   POST-OPERATIVE DIAGNOSIS:  repeat ceserean section   PROCEDURE:  Procedure(s): CESAREAN SECTION (N/A)  SURGEON:  Surgeon(s) and Role:    * Bovard-Stuckert, Jeanell Mangan, MD - Primary  ASSISTANTS: Genice Rouge RNFA   ANESTHESIA:   spinal  EBL:  331 cc, IVF and uop per anesthesia, clear urine   FINDINGS: viable female infant at 18:52, apgars 9/9; wt P; nl uterus, tubes and ovaries  BLOOD ADMINISTERED:none  DRAINS: Urinary Catheter (Foley)   LOCAL MEDICATIONS USED:  NONE  SPECIMEN:  Source of Specimen:  Placenta  DISPOSITION OF SPECIMEN:  L&D  COUNTS:  YES  TOURNIQUET:  * No tourniquets in log *  DICTATION: .Other Dictation: Dictation Number [id number]  PLAN OF CARE: Admit to inpatient   PATIENT DISPOSITION:  PACU - hemodynamically stable.   Delay start of Pharmacological VTE agent (>24hrs) due to surgical blood loss or risk of bleeding: not applicable

## 2019-10-18 NOTE — Patient Instructions (Signed)
Lacrecia Delval  10/18/2019   Your procedure is scheduled on:  10/26/2019  Arrive at 0530 at Entrance C on [address] at [facility]  and [employer]. You are invited to use the FREE valet parking or use the Visitor's parking deck.  Pick up the phone at the desk and dial [phone].  Call this number if you have problems the morning of surgery: [phone]  Remember:   Do not eat food:(After Midnight) Desps de medianoche.  Do not drink clear liquids: (After Midnight) Desps de medianoche.  Take these medicines the morning of surgery with A SIP OF WATER:  none   Do not wear jewelry, make-up or nail polish.  Do not wear lotions, powders, or perfumes. Do not wear deodorant.  Do not shave 48 hours prior to surgery.  Do not bring valuables to the hospital.  [facility] is not   responsible for any belongings or valuables brought to the hospital.  Contacts, dentures or bridgework may not be worn into surgery.  Leave suitcase in the car. After surgery it may be brought to your room.  For patients admitted to the hospital, checkout time is 11:00 AM the day of              discharge.      Please read over the following fact sheets that you were given:     Preparing for Surgery

## 2019-10-18 NOTE — Anesthesia Preprocedure Evaluation (Signed)
Anesthesia Evaluation  Patient identified by MRN, date of birth, ID band Patient awake    Reviewed: Allergy & Precautions, NPO status , Patient's Chart, lab work & pertinent test results  Airway Mallampati: II  TM Distance: >3 FB     Dental no notable dental hx. (+) Teeth Intact   Pulmonary Current Smoker and Patient abstained from smoking.,    Pulmonary exam normal breath sounds clear to auscultation       Cardiovascular negative cardio ROS Normal cardiovascular exam Rhythm:Regular Rate:Normal     Neuro/Psych negative neurological ROS  negative psych ROS   GI/Hepatic Neg liver ROS, GERD  ,  Endo/Other  negative endocrine ROS  Renal/GU Renal diseaseHx/o renal calculi  negative genitourinary   Musculoskeletal negative musculoskeletal ROS (+)   Abdominal (+) + obese,   Peds  Hematology  (+) anemia ,   Anesthesia Other Findings   Reproductive/Obstetrics (+) Pregnancy Previous C/Section x 2                             Anesthesia Physical Anesthesia Plan  ASA: II and emergent  Anesthesia Plan: Spinal   Post-op Pain Management:    Induction:   PONV Risk Score and Plan: 3 and Scopolamine patch - Pre-op and Treatment may vary due to age or medical condition  Airway Management Planned: Natural Airway  Additional Equipment:   Intra-op Plan:   Post-operative Plan:   Informed Consent: I have reviewed the patients History and Physical, chart, labs and discussed the procedure including the risks, benefits and alternatives for the proposed anesthesia with the patient or authorized representative who has indicated his/her understanding and acceptance.     Dental advisory given  Plan Discussed with: CRNA and Anesthesiologist  Anesthesia Plan Comments:         Anesthesia Quick Evaluation

## 2019-10-18 NOTE — MAU Provider Note (Signed)
History   [id number]   Chief Complaint  Patient presents with  . Rupture of Membranes    HPI Melissa Wiggins is a 34 y.o. female  [phone] @37 .6 wks here with report of gush of clear fluid this afternoon. She had some wetness in her underwear last night also. Pt reports rare contractions. She denies vaginal bleeding. She reports good fetal movement. All other systems negative. She is scheduled for RCS next week.  No LMP recorded. Patient is pregnant.  OB History  Gravida Para Term Preterm AB Living  4 2 2   1 2   SAB TAB Ectopic Multiple Live Births    1   0 2    # Outcome Date GA Lbr Len/2nd Weight Sex Delivery Anes PTL Lv  4 Current           3 Term 08/10/17 [redacted]w[redacted]d  3095 g M CS-LTranv Spinal  LIV  2 Term 2018 [redacted]w[redacted]d  3289 g F CS-LTranv   LIV  1 TAB             Past Medical History:  Diagnosis Date  . Kidney stones   . Medical history non-contributory     Family History  Problem Relation Age of Onset  . Diabetes Father     Social History   Socioeconomic History  . Marital status: Single    Spouse name: Not on file  . Number of children: Not on file  . Years of education: Not on file  . Highest education level: Not on file  Occupational History  . Not on file  Tobacco Use  . Smoking status: Current Every Day Smoker    Packs/day: 0.25  . Smokeless tobacco: Never Used  Substance and Sexual Activity  . Alcohol use: Not Currently  . Drug use: Never  . Sexual activity: Not on file  Other Topics Concern  . Not on file  Social History Narrative  . Not on file   Social Determinants of Health   Financial Resource Strain:   . Difficulty of Paying Living Expenses: Not on file  Food Insecurity:   . Worried About 2019 in the Last Year: Not on file  . Ran Out of Food in the Last Year: Not on file  Transportation Needs:   . Lack of Transportation (Medical): Not on file  . Lack of Transportation (Non-Medical): Not on file  Physical Activity:   .  Days of Exercise per Week: Not on file  . Minutes of Exercise per Session: Not on file  Stress:   . Feeling of Stress : Not on file  Social Connections:   . Frequency of Communication with Friends and Family: Not on file  . Frequency of Social Gatherings with Friends and Family: Not on file  . Attends Religious Services: Not on file  . Active Member of Clubs or Organizations: Not on file  . Attends [redacted]w[redacted]d Meetings: Not on file  . Marital Status: Not on file    No Known Allergies  No current facility-administered medications on file prior to encounter.   Current Outpatient Medications on File Prior to Encounter  Medication Sig Dispense Refill  . acetaminophen (TYLENOL) 500 MG tablet Take 1,000 mg by mouth every 6 (six) hours as needed (for pain.).    Programme researcher, broadcasting/film/video HYDROcodone-acetaminophen (NORCO/VICODIN) 5-325 MG tablet Take 1 tablet by mouth every 4 (four) hours as needed. 15 tablet 0  . ibuprofen (ADVIL,MOTRIN) 800 MG tablet Take 1 tablet (800 mg total)  by mouth every 8 (eight) hours as needed for mild pain. 30 tablet 0  . ondansetron (ZOFRAN ODT) 4 MG disintegrating tablet Take 1 tablet (4 mg total) by mouth every 6 (six) hours as needed for nausea or vomiting. 20 tablet 0     Review of Systems  Gastrointestinal: Negative for abdominal pain.  Genitourinary: Positive for vaginal discharge. Negative for vaginal bleeding.     Physical Exam   Vitals:   10/18/19 1637 10/18/19 1703  BP: 108/62   Pulse: 84   Resp: 18   Temp: 98.3 F (36.8 C)   Weight:  65.3 kg  Height:  5\' 7"  (1.702 m)   Physical Exam Vitals and nursing note reviewed. Exam conducted with a chaperone present.  Constitutional:      Appearance: Normal appearance.  HENT:     Head: Normocephalic and atraumatic.  Pulmonary:     Effort: Pulmonary effort is normal. No respiratory distress.  Abdominal:     Palpations: Abdomen is soft.     Tenderness: There is no abdominal tenderness.  Genitourinary:     Comments: SSE: no pool, fern pos  Musculoskeletal:        General: Normal range of motion.     Cervical back: Normal range of motion.  Skin:    General: Skin is warm and dry.  Neurological:     General: No focal deficit present.     Mental Status: She is alert and oriented to person, place, and time.  Psychiatric:        Mood and Affect: Mood normal.   EFM: 140 bpm, mod variability, + accels, no decels Toco: none  Results for orders placed or performed during the hospital encounter of 10/18/19 (from the past 24 hour(s))  POCT fern test     Status: None   Collection Time: 10/18/19  4:20 PM  Result Value Ref Range   POCT Fern Test Positive = ruptured amniotic membanes   Fern Test     Status: Abnormal   Collection Time: 10/18/19  4:40 PM  Result Value Ref Range   POCT Fern Test Positive = ruptured amniotic membanes    MAU Course  Procedures  MDM Labs ordered and reviewed. SROM confirmed. RN to inform primary OB provider.  Assessment and Plan   1. [redacted] weeks gestation of pregnancy   2. NST (non-stress test) reactive   3. Full-term premature rupture of membranes (PROM) with unknown onset of labor    Admit to OR Mngt per Dr. 10/20/19, [place], CNM 10/18/2019 5:15 PM

## 2019-10-18 NOTE — MAU Note (Signed)
Pt stated she had a small amount of fluid leak out last night. Had some ctx during the night but they stopped this morning. This afternoon at work when she got up from her chair she a a larger amount of clear fluid leak out. Good fetal movement reported. No cramping or ctx at this time.

## 2019-10-19 ENCOUNTER — Encounter (HOSPITAL_COMMUNITY): Payer: Self-pay | Admitting: Anesthesiology

## 2019-10-19 ENCOUNTER — Encounter (HOSPITAL_COMMUNITY): Payer: Self-pay | Admitting: Obstetrics and Gynecology

## 2019-10-19 LAB — RPR: RPR Ser Ql: NONREACTIVE

## 2019-10-19 LAB — CBC
HCT: 25.1 % — ABNORMAL LOW (ref 36.0–46.0)
Hemoglobin: 8.2 g/dL — ABNORMAL LOW (ref 12.0–15.0)
MCH: 30.5 pg (ref 26.0–34.0)
MCHC: 32.7 g/dL (ref 30.0–36.0)
MCV: 93.3 fL (ref 80.0–100.0)
Platelets: 216 10*3/uL (ref 150–400)
RBC: 2.69 MIL/uL — ABNORMAL LOW (ref 3.87–5.11)
RDW: 13.1 % (ref 11.5–15.5)
WBC: 12 10*3/uL — ABNORMAL HIGH (ref 4.0–10.5)
nRBC: 0 % (ref 0.0–0.2)

## 2019-10-19 MED ORDER — HYDROCODONE-ACETAMINOPHEN 5-325 MG PO TABS
1.0000 | ORAL_TABLET | ORAL | 0 refills | Status: AC | PRN
Start: 2019-10-19 — End: 2019-10-26

## 2019-10-19 MED ORDER — IBUPROFEN 600 MG PO TABS: 600.0000 mg | ORAL_TABLET | Freq: Four times a day (QID) | ORAL | 1 refills | Status: AC | PRN

## 2019-10-19 MED ORDER — FERROUS SULFATE 325 (65 FE) MG PO TABS: 325.0000 mg | ORAL_TABLET | ORAL | 3 refills | Status: AC

## 2019-10-19 MED ORDER — IBUPROFEN 800 MG PO TABS
800.0000 mg | ORAL_TABLET | Freq: Three times a day (TID) | ORAL | Status: DC
  Administered 2019-10-19 (×2): 800 mg via ORAL
  Filled 2019-10-19 (×2): qty 1

## 2019-10-19 NOTE — Discharge Summary (Signed)
Postpartum Discharge Summary  Date of Service updated     Patient Name: Melissa Wiggins DOB: 1985-10-06 MRN: [id number]  Date of admission: 10/18/2019 Delivery date:10/18/2019  Delivering provider: Janyth Contes  Date of discharge: 10/19/2019  Admitting diagnosis: History of low transverse cesarean section [Z98.891] Postpartum care following cesarean delivery [Z39.2] Intrauterine pregnancy: [redacted]w[redacted]d    Secondary diagnosis:  Principal Problem:   Status post repeat low transverse cesarean section Active Problems:   Postpartum care following cesarean delivery   History of low transverse cesarean section  Additional problems: iron deficiency anemia     Discharge diagnosis: Term Pregnancy Delivered and Anemia                                              Post partum procedures:n/a Augmentation: N/A Complications: None  Hospital course: Sceduled C/S   34y.o. yo [id number] admitted to the hospital 10/18/2019 after onset of labor with SROM. Pt with history of c/s and plan for repeat c/ s (  scheduled cesarean section next week). Thus, with the following indication:Elective Repeat.Delivery details are as follows:  Membrane Rupture Time/Date: 6:53 PM ,10/18/2019   Delivery Method:C-Section, Low Transverse  Details of operation can be found in separate operative note.  Patient had an uncomplicated postpartum course.  She is ambulating, tolerating a regular diet, passing flatus, and urinating well. Patient is discharged home in stable condition on  10/19/19        Newborn Data: Birth date:10/18/2019  Birth time:6:52 PM  Gender:Female  Living status:Living  Apgars:9 ,9  Weight:2824 g     Magnesium Sulfate received: No BMZ received: No Rhophylac:N/A MMR:N/A T-DaP:Given prenatally Flu: N/A Transfusion:No  Physical exam  Vitals:   10/19/19 0625 10/19/19 0755 10/19/19 1025 10/19/19 1335  BP: (!) 86/56  95/60 91/61  Pulse:   66 (!) 57  Resp:   18 16  Temp:   98.3 F  (36.8 C) 98.2 F (36.8 C)  TempSrc:    Axillary  SpO2:  99% 100%   Weight:      Height:       General: alert, cooperative and no distress Lochia: appropriate Uterine Fundus: firm Incision: Healing well with no significant drainage, Dressing is clean, dry, and intact DVT Evaluation: No evidence of DVT seen on physical exam. Labs: Lab Results  Component Value Date   WBC 12.0 (H) 10/19/2019   HGB 8.2 (L) 10/19/2019   HCT 25.1 (L) 10/19/2019   MCV 93.3 10/19/2019   PLT 216 10/19/2019   No flowsheet data found. Edinburgh Score: Edinburgh Postnatal Depression Scale Screening Tool 10/19/2019  I have been able to laugh and see the funny side of things. 0  I have looked forward with enjoyment to things. 0  I have blamed myself unnecessarily when things went wrong. 2  I have been anxious or worried for no good reason. 2  I have felt scared or panicky for no good reason. 2  Things have been getting on top of me. 1  I have been so unhappy that I have had difficulty sleeping. 1  I have felt sad or miserable. 1  I have been so unhappy that I have been crying. 0  The thought of harming myself has occurred to me. 0  Edinburgh Postnatal Depression Scale Total 9      After visit  meds:  Allergies as of 10/19/2019   No Known Allergies     Medication List    STOP taking these medications   ondansetron 4 MG disintegrating tablet Commonly known as: Zofran ODT     TAKE these medications   acetaminophen 500 MG tablet Commonly known as: TYLENOL Take 1,000 mg by mouth every 6 (six) hours as needed (for pain.).   ferrous sulfate 325 (65 FE) MG tablet Take 1 tablet (325 mg total) by mouth every other day.   HYDROcodone-acetaminophen 5-325 MG tablet Commonly known as: NORCO/VICODIN Take 1 tablet by mouth every 4 (four) hours as needed for up to 7 days for severe pain. What changed: reasons to take this   ibuprofen 600 MG tablet Commonly known as: ADVIL Take 1 tablet (600 mg total)  by mouth every 6 (six) hours as needed for moderate pain or cramping. What changed:   medication strength  how much to take  when to take this  reasons to take this        Discharge home in stable condition Infant Feeding: Breast Infant Disposition:rooming in Discharge instruction: per After Visit Summary and Postpartum booklet. Activity: Advance as tolerated. Pelvic rest for 6 weeks.  Diet: routine diet Anticipated Birth Control: Unsure Postpartum Appointment:2 week incision check and 6 week postpartum visit Additional Postpartum F/U: Incision check 2 weeks Future Appointments: Future Appointments  Date Time Provider [place]  10/24/2019  8:10 AM [facility]-MAU 1 [facility]INDC None   Follow up Visit:  [place], [place], DO. Schedule an appointment as soon as possible for a visit.   Specialty: Obstetrics and Gynecology Why: 2 weeks for incision check and 6 weeks for postpartum visit  Contact information: [address] [place] [address] [phone]                   10/19/2019 Isaiah Serge, DO

## 2019-10-19 NOTE — Progress Notes (Signed)
When assessing pt, her BP was 81/51. RN then reassessed BP 30 minutes later, it was 86/56. RN called Anesthesia per orders, was then directed to call pt's OB. RN then called the OB to notify of pt's current BP, no new orders were given. RN will continue to monitor and report off to next shift.   Herbert Moors, RN

## 2019-10-19 NOTE — Progress Notes (Addendum)
Called Dr. Mindi Slicker regarding FOB wanting pt to be discharged tonight.  FOB states that "Dr.Banga told us we could go at 24 hours during our prenatal visits". Dad extremely adamant about going home and was rude during our conversation. Transferred Dr. Mindi Slicker to patient's room.  States she did not discuss discharge with them at 24 hours.

## 2019-10-19 NOTE — Discharge Instructions (Signed)
Call office with any concerns [phone] 

## 2019-10-19 NOTE — Lactation Note (Signed)
This note was copied from a baby's chart. Lactation Consultation Note  Patient Name: Melissa Wiggins HFSFS'E Date: 10/19/2019  [facility] spoke with nurse, Melissa Wiggins, states Mom declined lactation services.    Melissa Wiggins  Nicholson-Springer 10/19/2019, 6:19 PM

## 2019-10-19 NOTE — Progress Notes (Signed)
Subjective: Postpartum Day 1: Cesarean Delivery Patient reports incisional pain and tolerating PO.  Nl lochia, pain controlled  Objective: Vital signs in last 24 hours: Temp:  [97.3 F (36.3 C)-98.3 F (36.8 C)] 97.8 F (36.6 C) (08/28 0530) Pulse Rate:  [67-84] 71 (08/28 0100) Resp:  [13-28] 18 (08/28 0530) BP: (81-108)/(51-73) 86/56 (08/28 0625) SpO2:  [97 %-100 %] 99 % (08/28 0755) Weight:  [65.3 kg] 65.3 kg (08/27 1703)  Physical Exam:  General: alert and no distress Lochia: appropriate Uterine Fundus: firm Incision: healing well DVT Evaluation: No evidence of DVT seen on physical exam.  Recent Labs    10/18/19 1646 10/19/19 0518  HGB 10.3* 8.2*  HCT 30.8* 25.1*    Assessment/Plan: Status post Cesarean section. Doing well postoperatively.  Continue current care.  Melissa Wiggins 10/19/2019, 9:01 AM

## 2019-10-19 NOTE — Progress Notes (Signed)
CSW received consult for hx of Anxiety and Depression, and EPDS score of 9. CSW met with MOB at bedside to offer support and complete assessment. On arrival, CSW introduced self and stated purpose for visit. FOB and infant, Millie were present, however, after PPD/A and SIDS education, FOB stepped out of room to offer MOB privacy during assessment. MOB and FOB presented as irritated by CSW presence. MOB and FOB expressed feeling tired and familiar with education/process, CSW offered to return at a later time, however, FOB asked that CSW proceed "let's just get it over".   CSW provided education regarding the baby blues period vs. perinatal mood disorders, discussed treatment and gave resources for mental health follow up if concerns arise.  CSW recommends self-evaluation during the postpartum time period using the New Mom Checklist from Postpartum Progress and encouraged MOB and FOB to contact a medical professional if symptoms are noted at any time.  MOB and FOB stated understanding and denied any questions. MOB denied any hx of postpartum depression or anxiety.  CSW provided review of Sudden Infant Death Syndrome (SIDS) precautions. MOB and FOB stated understanding and denied any questions.  MOB confirmed having all needed items for baby including car seat and crib for baby's safe sleep.   During assessment, MOB confirmed history of depression and anxiety. However, MOB reported sx have been managed well by active Rx for Seroquel 300 mg. MOB reported seeing psychiatrist every 6 weeks for [facility] medication management. MOB stated she is not seeing a therapist at this time. CSW offered [facility] therapeutic resources.  MOB denied any SI, HI, or domestic violence. MOB identified FOB as support and stated talking to him as coping skill when dealing with anxiety and/or depressive sx. MOB declined any additional support or resources.    CSW received and acknowledges consult for EDPS of 9.  Consult screened out due to 9 on  EDPS does not warrant a CSW consult.  MOB whom scores are greater than 9/yes to question 10 on Edinburgh Postpartum Depression Screen warrants a CSW consult.   CSW identifies no further need for intervention and no barriers to discharge at this time.  Aeron Donaghey D. Lissa Morales, MSW, [place] Clinical Social Worker [phone]

## 2019-10-19 NOTE — Progress Notes (Signed)
Subjective: Postpartum Day 1: Cesarean Delivery Patient reports tolerating PO, + flatus, + BM and no problems voiding. She reports lochia mild. She is ambulating with no lightheadedness. She denies HA, blurry vision, CP or SOB. She is bonding well with baby Pt desires discharge tonight. Baby has to stay due to elevated bilirubin however states was informed by pediatrician baby would likely be discharged at 9am tomorrow.  Objective: Vital signs in last 24 hours: Temp:  [97.3 F (36.3 C)-98.3 F (36.8 C)] 98.2 F (36.8 C) (08/28 1335) Pulse Rate:  [57-84] 57 (08/28 1335) Resp:  [13-28] 16 (08/28 1335) BP: (81-107)/(51-72) 91/61 (08/28 1335) SpO2:  [97 %-100 %] 100 % (08/28 1025)  Physical Exam:  General: alert, cooperative and no distress Lochia: appropriate Uterine Fundus: firm Incision: no significant drainage DVT Evaluation: No evidence of DVT seen on physical exam. No significant calf/ankle edema.  Recent Labs    10/18/19 1646 10/19/19 0518  HGB 10.3* 8.2*  HCT 30.8* 25.1*    Assessment/Plan: Status post Cesarean section. Doing well postoperatively.  Discussed with pt that given baby need to stay at least till am, could defer discharge to home till am. I explained that though the doctor on call typically rounds early in am, unforseen circumstances such as deliveries or unexpected need to perform a surgical procedure may cause rounds to be later in am. I also explained given report of feeling stable and no complaints, I could discharge her tonight and she could "nest" in room; this would mean nurses would no longer be responsible for medication dispensation, checking vitals etc.  After explaining both options, I offered them a chance to be discharged in am ( with understanding it may not be right at 9am) vs tonight then nest overnight. Pt and husband opted for discharge tonight then nest.  Discharge home with standard precautions and return to clinic in 2 weeks for incision check  and 6 weeks for postpartum visit Rx sent to [facility]  Cathrine Muster 10/19/2019, 8:02 PM

## 2019-10-21 NOTE — Op Note (Signed)
NAMESTARLA, Wiggins [facility] MEDICAL RECORD [id number] ACCOUNT [id number] DATE OF BIRTH:[birth year] FACILITY: [facility] LOCATION: [facility] PHYSICIAN:[name], MD  OPERATIVE REPORT  DATE OF PROCEDURE:  10/18/2019  PREOPERATIVE DIAGNOSIS:  Intrauterine pregnancy at 38+ weeks with history of cesarean section x2 and rupture of membranes.  POSTOPERATIVE DIAGNOSIS:  Intrauterine pregnancy at 38+ weeks with history of cesarean section x2 and rupture of membranes, delivered.  PROCEDURE:  Repeat low transverse cesarean section.  SURGEON:  Sherian Rein, MD  ASSISTANT:  Mamie Levers, RNFA.  ANESTHESIA:  Spinal.  ESTIMATED BLOOD LOSS:  Approximately 331 mL.   IV FLUID AND URINE OUTPUT:  Per anesthesia with clear urine at the end of the procedure.  FINDINGS:  Viable female infant at 1852 hours with Apgars of 9 at 1 minute and 9 at 5 minutes and a weight pending at the time of dictation.  Normal uterus, tubes, and ovaries were noted.  COMPLICATIONS:  None.  PATHOLOGY:  Placenta to labor and delivery.  DESCRIPTION OF PROCEDURE:  After informed consent was reviewed with the patient and her spouse, she was transported to the operating room where spinal anesthesia was placed and found to be adequate.  She was then returned to the supine position with a  leftward tilt.  After an appropriate timeout, she was prepped and draped in the normal sterile fashion.  A Pfannenstiel incision was made at the level of her previous incision, carried through to the underlying layer of fascia sharply.  The fascia was  incised in the midline.  The midline and incision was extended laterally with Mayo scissors.  Superior aspect of the fascial incision was grasped with Kocher clamps, elevated and the rectus muscles were dissected off both bluntly and sharply.  Midline  was easily identified and the peritoneum was entered bluntly.  The incision was extended superiorly and inferiorly with good visualization  of the bladder.  An Alexis skin retractor was placed carefully, making sure that no bowel was entrapped.  The  uterus was explored and incised in a transverse fashion.  Infant was delivered from a vertex presentation.  Nose and mouth were suctioned on the field.  Cord was clamped and cut.  After a minute of waiting, the nose and mouth had been suctioned, the  infant was handed off to the waiting pediatric staff.  The placenta was expressed from the uterus.  The uterus was cleared of all clot and debris.  Uterine incision was closed with 2 layers of 0 Monocryl, the first of which was running locked and the  second as an imbricating layer.  It was made hemostatic with several sutures of 3-0 Vicryl, as well as Arista.  The adnexa were explored, revealing normal tubes and ovaries.  The Alexis retractor was removed.  The peritoneum was reapproximated with 2-0  Vicryl in a running fashion.  The subfascial planes were inspected and found to be hemostatic.  The fascia was closed with a single suture of 0 Vicryl in a running fashion.  The subcutaneous adipose tissue was made hemostatic with Bovie cautery and the  dead space was closed with plain gut.  The skin was closed with 4-0 Vicryl in a subcuticular fashion on a Keith needle.  Benzoin and Steri-Strips were applied.  The patient tolerated the procedure well.  Sponge, lap, and needle count was correct x2.  VN/NUANCE  D:10/18/2019 T:10/18/2019 JOB:[id number]

## 2019-10-23 DIAGNOSIS — F17200 Nicotine dependence, unspecified, uncomplicated: Secondary | ICD-10-CM | POA: Diagnosis not present

## 2019-10-23 DIAGNOSIS — F3189 Other bipolar disorder: Secondary | ICD-10-CM | POA: Diagnosis not present

## 2019-10-23 DIAGNOSIS — Z00110 Health examination for newborn under 8 days old: Secondary | ICD-10-CM | POA: Diagnosis not present

## 2019-10-24 ENCOUNTER — Other Ambulatory Visit (HOSPITAL_COMMUNITY)
Admission: RE | Admit: 2019-10-24 | Discharge: 2019-10-24 | Disposition: A | Discharge status: Home / Self Care | Payer: BC Managed Care – PPO | Source: Ambulatory Visit | Attending: Obstetrics and Gynecology | Admitting: Obstetrics and Gynecology

## 2019-10-26 ENCOUNTER — Inpatient Hospital Stay (HOSPITAL_COMMUNITY)
Admission: RE | Admit: 2019-10-26 | Payer: BC Managed Care – PPO | Source: Home / Self Care | Admitting: Obstetrics and Gynecology

## 2019-10-26 ENCOUNTER — Encounter (HOSPITAL_COMMUNITY): Admission: RE | Payer: Self-pay | Source: Home / Self Care

## 2019-10-26 SURGERY — Surgical Case
Anesthesia: Spinal

## 2019-11-27 DIAGNOSIS — Z3009 Encounter for other general counseling and advice on contraception: Secondary | ICD-10-CM | POA: Diagnosis not present

## 2019-11-27 DIAGNOSIS — Z1389 Encounter for screening for other disorder: Secondary | ICD-10-CM | POA: Diagnosis not present

## 2019-11-27 DIAGNOSIS — Z3040 Encounter for surveillance of contraceptives, unspecified: Secondary | ICD-10-CM | POA: Diagnosis not present

## 2019-12-03 DIAGNOSIS — F3189 Other bipolar disorder: Secondary | ICD-10-CM | POA: Diagnosis not present

## 2019-12-03 DIAGNOSIS — F17200 Nicotine dependence, unspecified, uncomplicated: Secondary | ICD-10-CM | POA: Diagnosis not present

## 2020-01-28 DIAGNOSIS — F3189 Other bipolar disorder: Secondary | ICD-10-CM | POA: Diagnosis not present

## 2020-01-28 DIAGNOSIS — F17200 Nicotine dependence, unspecified, uncomplicated: Secondary | ICD-10-CM | POA: Diagnosis not present

## 2020-02-12 DIAGNOSIS — N390 Urinary tract infection, site not specified: Secondary | ICD-10-CM | POA: Diagnosis not present

## 2020-03-19 DIAGNOSIS — F1020 Alcohol dependence, uncomplicated: Secondary | ICD-10-CM | POA: Diagnosis not present

## 2020-03-19 DIAGNOSIS — F1220 Cannabis dependence, uncomplicated: Secondary | ICD-10-CM | POA: Diagnosis not present

## 2020-03-19 DIAGNOSIS — F1520 Other stimulant dependence, uncomplicated: Secondary | ICD-10-CM | POA: Diagnosis not present

## 2020-03-19 DIAGNOSIS — F339 Major depressive disorder, recurrent, unspecified: Secondary | ICD-10-CM | POA: Diagnosis not present

## 2020-03-20 DIAGNOSIS — F1520 Other stimulant dependence, uncomplicated: Secondary | ICD-10-CM | POA: Diagnosis not present

## 2020-03-20 DIAGNOSIS — F1020 Alcohol dependence, uncomplicated: Secondary | ICD-10-CM | POA: Diagnosis not present

## 2020-03-20 DIAGNOSIS — F1220 Cannabis dependence, uncomplicated: Secondary | ICD-10-CM | POA: Diagnosis not present

## 2020-03-20 DIAGNOSIS — F339 Major depressive disorder, recurrent, unspecified: Secondary | ICD-10-CM | POA: Diagnosis not present

## 2020-03-21 DIAGNOSIS — F339 Major depressive disorder, recurrent, unspecified: Secondary | ICD-10-CM | POA: Diagnosis not present

## 2020-03-21 DIAGNOSIS — F1520 Other stimulant dependence, uncomplicated: Secondary | ICD-10-CM | POA: Diagnosis not present

## 2020-03-21 DIAGNOSIS — F1020 Alcohol dependence, uncomplicated: Secondary | ICD-10-CM | POA: Diagnosis not present

## 2020-03-21 DIAGNOSIS — F1220 Cannabis dependence, uncomplicated: Secondary | ICD-10-CM | POA: Diagnosis not present

## 2020-03-26 DIAGNOSIS — F1520 Other stimulant dependence, uncomplicated: Secondary | ICD-10-CM | POA: Diagnosis not present

## 2020-03-26 DIAGNOSIS — F1020 Alcohol dependence, uncomplicated: Secondary | ICD-10-CM | POA: Diagnosis not present

## 2020-03-26 DIAGNOSIS — F1220 Cannabis dependence, uncomplicated: Secondary | ICD-10-CM | POA: Diagnosis not present

## 2020-03-26 DIAGNOSIS — F339 Major depressive disorder, recurrent, unspecified: Secondary | ICD-10-CM | POA: Diagnosis not present

## 2020-04-02 DIAGNOSIS — F339 Major depressive disorder, recurrent, unspecified: Secondary | ICD-10-CM | POA: Diagnosis not present

## 2020-04-02 DIAGNOSIS — F1220 Cannabis dependence, uncomplicated: Secondary | ICD-10-CM | POA: Diagnosis not present

## 2020-04-02 DIAGNOSIS — F1020 Alcohol dependence, uncomplicated: Secondary | ICD-10-CM | POA: Diagnosis not present

## 2020-04-02 DIAGNOSIS — F1520 Other stimulant dependence, uncomplicated: Secondary | ICD-10-CM | POA: Diagnosis not present

## 2020-04-06 DIAGNOSIS — F1020 Alcohol dependence, uncomplicated: Secondary | ICD-10-CM | POA: Diagnosis not present

## 2020-04-06 DIAGNOSIS — F1520 Other stimulant dependence, uncomplicated: Secondary | ICD-10-CM | POA: Diagnosis not present

## 2020-04-06 DIAGNOSIS — F1220 Cannabis dependence, uncomplicated: Secondary | ICD-10-CM | POA: Diagnosis not present

## 2020-04-07 DIAGNOSIS — F1520 Other stimulant dependence, uncomplicated: Secondary | ICD-10-CM | POA: Diagnosis not present

## 2020-04-07 DIAGNOSIS — F1020 Alcohol dependence, uncomplicated: Secondary | ICD-10-CM | POA: Diagnosis not present

## 2020-04-07 DIAGNOSIS — F1220 Cannabis dependence, uncomplicated: Secondary | ICD-10-CM | POA: Diagnosis not present

## 2020-04-08 DIAGNOSIS — F1220 Cannabis dependence, uncomplicated: Secondary | ICD-10-CM | POA: Diagnosis not present

## 2020-04-08 DIAGNOSIS — F1520 Other stimulant dependence, uncomplicated: Secondary | ICD-10-CM | POA: Diagnosis not present

## 2020-04-08 DIAGNOSIS — F1020 Alcohol dependence, uncomplicated: Secondary | ICD-10-CM | POA: Diagnosis not present

## 2020-04-09 DIAGNOSIS — F339 Major depressive disorder, recurrent, unspecified: Secondary | ICD-10-CM | POA: Diagnosis not present

## 2020-04-09 DIAGNOSIS — F1220 Cannabis dependence, uncomplicated: Secondary | ICD-10-CM | POA: Diagnosis not present

## 2020-04-09 DIAGNOSIS — F32A Depression, unspecified: Secondary | ICD-10-CM | POA: Diagnosis not present

## 2020-04-09 DIAGNOSIS — F1520 Other stimulant dependence, uncomplicated: Secondary | ICD-10-CM | POA: Diagnosis not present

## 2020-04-09 DIAGNOSIS — F1020 Alcohol dependence, uncomplicated: Secondary | ICD-10-CM | POA: Diagnosis not present

## 2020-04-10 DIAGNOSIS — F1020 Alcohol dependence, uncomplicated: Secondary | ICD-10-CM | POA: Diagnosis not present

## 2020-04-10 DIAGNOSIS — F1520 Other stimulant dependence, uncomplicated: Secondary | ICD-10-CM | POA: Diagnosis not present

## 2020-04-10 DIAGNOSIS — F1220 Cannabis dependence, uncomplicated: Secondary | ICD-10-CM | POA: Diagnosis not present

## 2020-04-13 DIAGNOSIS — F1220 Cannabis dependence, uncomplicated: Secondary | ICD-10-CM | POA: Diagnosis not present

## 2020-04-13 DIAGNOSIS — F1520 Other stimulant dependence, uncomplicated: Secondary | ICD-10-CM | POA: Diagnosis not present

## 2020-04-13 DIAGNOSIS — F1020 Alcohol dependence, uncomplicated: Secondary | ICD-10-CM | POA: Diagnosis not present

## 2020-04-14 DIAGNOSIS — F1220 Cannabis dependence, uncomplicated: Secondary | ICD-10-CM | POA: Diagnosis not present

## 2020-04-14 DIAGNOSIS — F1520 Other stimulant dependence, uncomplicated: Secondary | ICD-10-CM | POA: Diagnosis not present

## 2020-04-14 DIAGNOSIS — F1020 Alcohol dependence, uncomplicated: Secondary | ICD-10-CM | POA: Diagnosis not present

## 2020-04-15 DIAGNOSIS — F1520 Other stimulant dependence, uncomplicated: Secondary | ICD-10-CM | POA: Diagnosis not present

## 2020-04-15 DIAGNOSIS — F1220 Cannabis dependence, uncomplicated: Secondary | ICD-10-CM | POA: Diagnosis not present

## 2020-04-15 DIAGNOSIS — F1020 Alcohol dependence, uncomplicated: Secondary | ICD-10-CM | POA: Diagnosis not present

## 2020-04-16 DIAGNOSIS — F1220 Cannabis dependence, uncomplicated: Secondary | ICD-10-CM | POA: Diagnosis not present

## 2020-04-16 DIAGNOSIS — F1020 Alcohol dependence, uncomplicated: Secondary | ICD-10-CM | POA: Diagnosis not present

## 2020-04-16 DIAGNOSIS — F1520 Other stimulant dependence, uncomplicated: Secondary | ICD-10-CM | POA: Diagnosis not present

## 2020-04-16 DIAGNOSIS — F339 Major depressive disorder, recurrent, unspecified: Secondary | ICD-10-CM | POA: Diagnosis not present

## 2020-04-17 DIAGNOSIS — F1020 Alcohol dependence, uncomplicated: Secondary | ICD-10-CM | POA: Diagnosis not present

## 2020-04-17 DIAGNOSIS — F1220 Cannabis dependence, uncomplicated: Secondary | ICD-10-CM | POA: Diagnosis not present

## 2020-04-17 DIAGNOSIS — F1520 Other stimulant dependence, uncomplicated: Secondary | ICD-10-CM | POA: Diagnosis not present

## 2020-04-23 DIAGNOSIS — F3132 Bipolar disorder, current episode depressed, moderate: Secondary | ICD-10-CM | POA: Diagnosis not present

## 2020-04-23 DIAGNOSIS — F411 Generalized anxiety disorder: Secondary | ICD-10-CM | POA: Diagnosis not present

## 2020-04-23 DIAGNOSIS — F1011 Alcohol abuse, in remission: Secondary | ICD-10-CM | POA: Diagnosis not present

## 2020-04-28 ENCOUNTER — Encounter: Payer: Self-pay | Admitting: Medical

## 2020-04-28 ENCOUNTER — Ambulatory Visit (INDEPENDENT_AMBULATORY_CARE_PROVIDER_SITE_OTHER): Payer: Self-pay | Admitting: Medical

## 2020-04-28 ENCOUNTER — Other Ambulatory Visit: Payer: Self-pay

## 2020-04-28 VITALS — BP 92/68 | HR 95 | Ht 66.0 in | Wt 130.0 lb

## 2020-04-28 DIAGNOSIS — F1010 Alcohol abuse, uncomplicated: Secondary | ICD-10-CM | POA: Insufficient documentation

## 2020-04-28 DIAGNOSIS — D509 Iron deficiency anemia, unspecified: Secondary | ICD-10-CM

## 2020-04-28 DIAGNOSIS — Z79899 Other long term (current) drug therapy: Secondary | ICD-10-CM | POA: Insufficient documentation

## 2020-04-28 DIAGNOSIS — R251 Tremor, unspecified: Secondary | ICD-10-CM

## 2020-04-28 DIAGNOSIS — G479 Sleep disorder, unspecified: Secondary | ICD-10-CM

## 2020-04-28 NOTE — Patient Instructions (Signed)
RESOURCES in [place], [place]  If you are experiencing a mental health crisis or an emergency, please call 911 or go to the nearest emergency department.  [facility]   [phone] [facility]  [phone] [facility]   [phone]  Suicide Hotline 1-800-Suicide 586 419 2045)  National Suicide Prevention Lifeline 984-226-0075  910-246-8497)  Domestic Violence, Rape/Crisis - [facility] [place] 956-387-5643  [employer] Violence Hotline 1-800-799-SAFE 236-678-2082)  To report Child or Elder Abuse, please call: [facility] Police Department  [phone] [facility] Department  [phone]  Teen Crisis line (404)545-7286 or [phone]     Psychiatry and Counseling services  [facility] [address], [place], [place] [address] [phone]  Dr. Meredith Staggers, psychiatrist Dr. Beverly Milch, child psychiatrist   Dr. Len Blalock Child and teen psychiatrist [address], [place], [place] [address] [phone]   Dr. Milagros Evener, psychiatry [address] [place], [place] [address] [phone]    [facility] Crisis Line and Main phone number [phone]  [facility]  [phone]  [facility] [phone]  Adult Crisis Center [phone]     Another good resource for future reference is [place], [place] [address] [phone]

## 2020-04-28 NOTE — Progress Notes (Signed)
Waiting for response from [facility].

## 2020-04-28 NOTE — Progress Notes (Signed)
Subjective:  Melissa Wiggins is a 35 y.o. female who presents for Chief Complaint  Patient presents with  . New Patient (Initial Visit)    Establish care- just got out of rehab for alcohol. Has shakes in hands      Here to establish care.  Just recently 2 saturdays ago was released from rehab facility for alcohol abuse.  They got her back her on track with medications, but that was in [place].   Has her medication with her today. Completed a 30 day course there in [place].   Moved here in 2019.    Was seeing a psychiatrist with the Rehab facility and they were managing her medications.  She now needs someone else to manage her medications.  This was her first time in a rehab facility but has had a 10year + problem with alcohol addiction.  She just recently called to get counseling appt lined up.  Starting Friday, she will be seeing counselor weekly, Melissa Wiggins, here in [place].  She has 3 children, 4yo, 3yo, and 96mo.   She and her baby's father live together but are likely separating.  He has been watching the children while she has been in rehab.  She notes that he is a good father.  She starts back at her former job at [employer] next week.  She has done this work for 10 years.    Her current medications aren't helping with sleep.   Currently has trouble getting and staying asleep but then also feels hard time getting up and going in the morning, sluggish.  No hx/o sleep apnea or sleep disorder.  No prior sleep study  The facility advised she had updated labs upon recheck regarding screening for liver, thyroid, triglycerides, lithium level.  She has no hx/o pancreatitis.   Prior to rehab she was taking only 2 medicaiton.  Several medications were added or adjusted with the residential program.  Of note, lithium dose is 300mg  am and 600mg  pm.    No other aggravating or relieving factors.    No other c/o.  The following portions of the patient's  history were reviewed and updated as appropriate: allergies, current medications, past family history, past medical history, past social history, past surgical history and problem list.  ROS Otherwise as in subjective above    Objective: BP 92/68   Pulse 95   Ht 5\' 6"  (1.676 m)   Wt 130 lb (59 kg)   SpO2 97%   BMI 20.98 kg/m   General appearance: alert, no distress, well developed, well nourished, white female Lots of tattos arms, chest HEENT: normocephalic, sclerae anicteric, conjunctiva pink and moist, TMs pearly, nares patent, no discharge or erythema, pharynx normal Oral cavity: MMM, no lesions, no enlarged tonsils. Neck: supple, no lymphadenopathy, no thyromegaly, no masses Heart: RRR, normal S1, S2, no murmurs Lungs: CTA bilaterally, no wheezes, rhonchi, or rales Pulses: 2+ radial pulses, 2+ pedal pulses, normal cap refill Ext: no edema +mild to moderate physiological tremor noted bilat arms, otherwise CN 2-12 intact    Assessment: Encounter Diagnoses  Name Primary?  . High risk medication use Yes  . Lithium use   . Alcohol abuse   . Sleep disturbance   . Tremor   . Iron deficiency anemia, unspecified iron deficiency anemia type      Plan: I reviewed her medications in detail.  We will try and get her in with a psychiatrist.  She already has an appointment tomorrow with a  counselor fortunately.  Not sure how to manage her sleep issues that she is already on heavy-duty sleep aids.  I will consider options on this.  Looking back in the chart record she was pregnant a few months ago in the fall and was quite anemic.  We will recheck those labs.  Follow-up pending labs and referral  Melissa Wiggins was seen today for new patient (initial visit).  Diagnoses and all orders for this visit:  High risk medication use -     Lithium level -     CBC with Differential/Platelet -     Lipid panel -     TSH -     Comprehensive metabolic panel -     Vitamin B12 -      Folate  Lithium use -     Lithium level -     CBC with Differential/Platelet -     Lipid panel -     TSH -     Comprehensive metabolic panel  Alcohol abuse -     CBC with Differential/Platelet -     Comprehensive metabolic panel -     Vitamin B12 -     Folate  Sleep disturbance  Tremor -     Vitamin B12 -     Folate  Iron deficiency anemia, unspecified iron deficiency anemia type -     CBC with Differential/Platelet -     Vitamin B12 -     Folate    Follow up: pending labs, referral to psychiatry

## 2020-04-29 ENCOUNTER — Telehealth: Payer: Self-pay

## 2020-04-29 LAB — COMPREHENSIVE METABOLIC PANEL
ALT: 21 IU/L (ref 0–32)
AST: 18 IU/L (ref 0–40)
Albumin/Globulin Ratio: 1.6 (ref 1.2–2.2)
Albumin: 4.3 g/dL (ref 3.8–4.8)
Alkaline Phosphatase: 61 IU/L (ref 44–121)
BUN/Creatinine Ratio: 9 (ref 9–23)
BUN: 7 mg/dL (ref 6–20)
Bilirubin Total: <0.2 mg/dL (ref 0.0–1.2)
CO2: 24 mmol/L (ref 20–29)
Calcium: 10 mg/dL (ref 8.7–10.2)
Chloride: 99 mmol/L (ref 96–106)
Creatinine, Ser: 0.82 mg/dL (ref 0.57–1.00)
Globulin, Total: 2.7 g/dL (ref 1.5–4.5)
Glucose: 90 mg/dL (ref 65–99)
Potassium: 4.6 mmol/L (ref 3.5–5.2)
Sodium: 138 mmol/L (ref 134–144)
Total Protein: 7 g/dL (ref 6.0–8.5)
eGFR: 97 mL/min/{1.73_m2} (ref >59)

## 2020-04-29 LAB — CBC WITH DIFFERENTIAL/PLATELET
Basophils Absolute: 0.1 10*3/uL (ref 0.0–0.2)
Basos: 2 %
EOS (ABSOLUTE): 0.4 10*3/uL (ref 0.0–0.4)
Eos: 6 %
Hematocrit: 36.3 % (ref 34.0–46.6)
Hemoglobin: 11.8 g/dL (ref 11.1–15.9)
Immature Grans (Abs): 0 10*3/uL (ref 0.0–0.1)
Immature Granulocytes: 0 %
Lymphocytes Absolute: 1.4 10*3/uL (ref 0.7–3.1)
Lymphs: 22 %
MCH: 27.8 pg (ref 26.6–33.0)
MCHC: 32.5 g/dL (ref 31.5–35.7)
MCV: 85 fL (ref 79–97)
Monocytes Absolute: 0.4 10*3/uL (ref 0.1–0.9)
Monocytes: 6 %
Neutrophils Absolute: 4.1 10*3/uL (ref 1.4–7.0)
Neutrophils: 64 %
Platelets: 317 10*3/uL (ref 150–450)
RBC: 4.25 x10E6/uL (ref 3.77–5.28)
RDW: 16.8 % — ABNORMAL HIGH (ref 11.7–15.4)
WBC: 6.4 10*3/uL (ref 3.4–10.8)

## 2020-04-29 LAB — LITHIUM LEVEL: Lithium Lvl: 0.8 mmol/L (ref 0.5–1.2)

## 2020-04-29 LAB — FOLATE: Folate: 9.1 ng/mL (ref >3.0)

## 2020-04-29 LAB — LIPID PANEL
Chol/HDL Ratio: 4.6 ratio — ABNORMAL HIGH (ref 0.0–4.4)
Cholesterol, Total: 147 mg/dL (ref 100–199)
HDL: 32 mg/dL — ABNORMAL LOW (ref >39)
LDL Chol Calc (NIH): 94 mg/dL (ref 0–99)
Triglycerides: 117 mg/dL (ref 0–149)
VLDL Cholesterol Cal: 21 mg/dL (ref 5–40)

## 2020-04-29 LAB — VITAMIN B12: Vitamin B-12: 403 pg/mL (ref 232–1245)

## 2020-04-29 LAB — TSH: TSH: 2.32 u[IU]/mL (ref 0.450–4.500)

## 2020-04-29 NOTE — Progress Notes (Signed)
Patient has been referred to [facility]. Process has been started

## 2020-04-29 NOTE — Telephone Encounter (Signed)
Pt. Called wanting to know if there was anyway you could help her out with a doctor's note to return to work on 05/05/20. She was recently out of a rehab facility a week from 04/24/20. She called her case worker all last week which contacted the facility she was at and they finally gave her a work note after calling for a week, but the note had her returning to work on 04/24/20 and she is not supposed to return to work until 05/05/20. She wanted to know if there was anyway possible you could write her a note to return to work on 05/05/20.

## 2020-04-29 NOTE — Telephone Encounter (Signed)
That is fine, but if she feels up to going back sooner, then go back sooner.  I understand if she needs time to get her affairs in order to go back

## 2020-04-30 ENCOUNTER — Encounter: Payer: Self-pay | Admitting: Medical

## 2020-04-30 NOTE — Telephone Encounter (Signed)
I typed a letter for the pt. Called her to come pick it up was unable to LM.

## 2020-04-30 NOTE — Telephone Encounter (Signed)
Pt. Called back I let her know letter was ready for pick up and she wanted to know her labs and recommendations, which she is now aware of.

## 2020-05-05 ENCOUNTER — Telehealth: Payer: Self-pay

## 2020-05-05 NOTE — Telephone Encounter (Signed)
yes

## 2020-05-05 NOTE — Telephone Encounter (Signed)
This message is to inform that we received an update from [facility] informing that patient has scheduled an appointment with Joeseph Amor for Psychiatry.

## 2020-05-05 NOTE — Telephone Encounter (Signed)
This this [facility] process seem to be working okay?  Getting people an appointment in a timely manner?

## 2020-05-24 ENCOUNTER — Other Ambulatory Visit: Payer: Self-pay

## 2020-05-24 ENCOUNTER — Emergency Department (HOSPITAL_COMMUNITY)
Admission: EM | Admit: 2020-05-24 | Discharge: 2020-05-25 | Disposition: A | Discharge status: Home / Self Care | Payer: Self-pay | Attending: Emergency Medicine | Admitting: Emergency Medicine

## 2020-05-24 DIAGNOSIS — F10121 Alcohol abuse with intoxication delirium: Secondary | ICD-10-CM

## 2020-05-24 DIAGNOSIS — Y908 Blood alcohol level of 240 mg/100 ml or more: Secondary | ICD-10-CM | POA: Insufficient documentation

## 2020-05-24 DIAGNOSIS — Z20822 Contact with and (suspected) exposure to covid-19: Secondary | ICD-10-CM | POA: Insufficient documentation

## 2020-05-24 DIAGNOSIS — R45851 Suicidal ideations: Secondary | ICD-10-CM

## 2020-05-24 DIAGNOSIS — R451 Restlessness and agitation: Secondary | ICD-10-CM

## 2020-05-24 DIAGNOSIS — R4182 Altered mental status, unspecified: Secondary | ICD-10-CM | POA: Insufficient documentation

## 2020-05-24 DIAGNOSIS — F17200 Nicotine dependence, unspecified, uncomplicated: Secondary | ICD-10-CM | POA: Insufficient documentation

## 2020-05-24 DIAGNOSIS — F10129 Alcohol abuse with intoxication, unspecified: Secondary | ICD-10-CM | POA: Insufficient documentation

## 2020-05-24 LAB — RAPID URINE DRUG SCREEN, HOSP PERFORMED
Amphetamines: NOT DETECTED
Barbiturates: NOT DETECTED
Benzodiazepines: NOT DETECTED
Cocaine: NOT DETECTED
Opiates: NOT DETECTED
Tetrahydrocannabinol: POSITIVE — AB

## 2020-05-24 LAB — RESP PANEL BY RT-PCR (FLU A&B, COVID) ARPGX2
Influenza A by PCR: NEGATIVE
Influenza B by PCR: NEGATIVE
SARS Coronavirus 2 by RT PCR: NEGATIVE

## 2020-05-24 LAB — CBC WITH DIFFERENTIAL/PLATELET
Abs Immature Granulocytes: 0.04 10*3/uL (ref 0.00–0.07)
Basophils Absolute: 0.1 10*3/uL (ref 0.0–0.1)
Basophils Relative: 1 %
Eosinophils Absolute: 0.1 10*3/uL (ref 0.0–0.5)
Eosinophils Relative: 1 %
HCT: 35.6 % — ABNORMAL LOW (ref 36.0–46.0)
Hemoglobin: 11.5 g/dL — ABNORMAL LOW (ref 12.0–15.0)
Immature Granulocytes: 0 %
Lymphocytes Relative: 24 %
Lymphs Abs: 2.5 10*3/uL (ref 0.7–4.0)
MCH: 28.5 pg (ref 26.0–34.0)
MCHC: 32.3 g/dL (ref 30.0–36.0)
MCV: 88.1 fL (ref 80.0–100.0)
Monocytes Absolute: 0.4 10*3/uL (ref 0.1–1.0)
Monocytes Relative: 4 %
Neutro Abs: 7.7 10*3/uL (ref 1.7–7.7)
Neutrophils Relative %: 70 %
Platelets: 335 10*3/uL (ref 150–400)
RBC: 4.04 MIL/uL (ref 3.87–5.11)
RDW: 17.3 % — ABNORMAL HIGH (ref 11.5–15.5)
WBC: 10.8 10*3/uL — ABNORMAL HIGH (ref 4.0–10.5)
nRBC: 0 % (ref 0.0–0.2)

## 2020-05-24 LAB — I-STAT BETA HCG BLOOD, ED (MC, WL, AP ONLY): I-stat hCG, quantitative: <5 m[IU]/mL (ref <5)

## 2020-05-24 LAB — LITHIUM LEVEL: Lithium Lvl: 0.54 mmol/L — ABNORMAL LOW (ref 0.60–1.20)

## 2020-05-24 MED ORDER — DIPHENHYDRAMINE HCL 50 MG/ML IJ SOLN
INTRAMUSCULAR | Status: AC
  Filled 2020-05-24: qty 1

## 2020-05-24 MED ORDER — HALOPERIDOL LACTATE 5 MG/ML IJ SOLN
INTRAMUSCULAR | Status: AC
  Administered 2020-05-24: 10 mg via INTRAMUSCULAR
  Filled 2020-05-24: qty 2

## 2020-05-24 MED ORDER — DIPHENHYDRAMINE HCL 50 MG/ML IJ SOLN
25.0000 mg | Freq: Once | INTRAMUSCULAR | Status: AC
  Administered 2020-05-24: 25 mg via INTRAVENOUS

## 2020-05-24 MED ORDER — HALOPERIDOL LACTATE 5 MG/ML IJ SOLN: 10.0000 mg | Freq: Once | INTRAMUSCULAR | Status: AC

## 2020-05-24 NOTE — ED Triage Notes (Signed)
Pt brought in by [employer] under IVC order

## 2020-05-24 NOTE — ED Notes (Signed)
Pt put in soft-restraints per order from [facility] MD. Will chart according to soft-restraint protocol.

## 2020-05-24 NOTE — ED Notes (Signed)
Pt put in restraints at 2145

## 2020-05-24 NOTE — ED Provider Notes (Signed)
[facility]-EMERGENCY DEPT Provider Note   CSN: [id number] Arrival date & time: 05/24/20  2125     History No chief complaint on file.   Melissa Wiggins is a 35 y.o. female.  Patient is a 35 year old female who comes in with TPD under IVC orders.  They state that patient's husband states that she has been drinking lots of alcohol and has been combative.  She has been making threats of wanting to kill herself.  She has a history of bipolar disorder and borderline personality disorder per his report.  She was brought from home.  It is unclear if she had any ingestions other than alcohol.  History is limited due to her altered state.        Past Medical History:  Diagnosis Date  . Kidney stones   . Medical history non-contributory   . Status post repeat low transverse cesarean section 10/18/2019    Patient Active Problem List   Diagnosis Date Noted  . Alcohol abuse 04/28/2020  . Lithium use 04/28/2020  . High risk medication use 04/28/2020  . Sleep disturbance 04/28/2020  . Tremor 04/28/2020  . Iron deficiency anemia 04/28/2020  . Status post repeat low transverse cesarean section 10/18/2019  . History of low transverse cesarean section 10/18/2019  . Previous cesarean delivery affecting pregnancy 08/10/2017  . Postpartum care following cesarean delivery 08/10/2017    Past Surgical History:  Procedure Laterality Date  . CESAREAN SECTION    . CESAREAN SECTION N/A 08/10/2017   Procedure: REPEAT CESAREAN SECTION;  Surgeon: Edwinna Areola, DO;  Location: [facility];  Service: Obstetrics;  Laterality: N/A;  Heather, RNFA  . CESAREAN SECTION N/A 10/18/2019   Procedure: CESAREAN SECTION;  Surgeon: Sherian Rein, MD;  Location: [facility] LD ORS;  Service: Obstetrics;  Laterality: N/A;     OB History    Gravida  4   Para  3   Term  3   Preterm      AB  1   Living  3     SAB      IAB  1   Ectopic      Multiple  0   Live  Births  3           Family History  Problem Relation Age of Onset  . Diabetes Father     Social History   Tobacco Use  . Smoking status: Current Some Day Smoker    Packs/day: 0.25  . Smokeless tobacco: Never Used  Substance Use Topics  . Alcohol use: Not Currently  . Drug use: Never    Home Medications Prior to Admission medications   Medication Sig Start Date End Date Taking? Authorizing Provider  acetaminophen (TYLENOL) 500 MG tablet Take 1,000 mg by mouth every 6 (six) hours as needed (for pain.).    [provider]  docusate sodium (COLACE) 100 MG capsule Take 100 mg by mouth daily as needed for mild constipation.    [provider]  ferrous sulfate 325 (65 FE) MG tablet Take 1 tablet (325 mg total) by mouth every other day. Patient not taking: Reported on 04/28/2020 10/19/19   Edwinna Areola, DO  hydrOXYzine (VISTARIL) 50 MG capsule 3 (three) times daily as needed. 03/19/20   [provider]  ibuprofen (ADVIL) 600 MG tablet Take 1 tablet (600 mg total) by mouth every 6 (six) hours as needed for moderate pain or cramping. 10/19/19   Edwinna Areola, DO  lithium  carbonate 300 MG capsule Take 300 mg by mouth 2 (two) times daily with a meal. 1 morning, 2 QHS 04/16/20   [provider]  naltrexone (DEPADE) 50 MG tablet Take 50 mg by mouth daily.    [provider]  QUEtiapine (SEROQUEL XR) 300 MG 24 hr tablet SMARTSIG:1 Tablet(s) By Mouth Every Evening 04/24/20   [provider]  traZODone (DESYREL) 50 MG tablet Take 100 mg by mouth at bedtime as needed. 03/19/20   [provider]    Allergies    Patient has no known allergies.  Review of Systems   Review of Systems  Unable to perform ROS: Mental status change    Physical Exam Updated Vital Signs BP 112/88 (BP Location: Right Arm)   Pulse 98   Temp 98.9 F (37.2 C) (Oral)   Resp 16   SpO2 96%   Physical Exam Constitutional:      Appearance: She  is well-developed.     Comments: Quite agitated and cursing profusely  HENT:     Head: Normocephalic and atraumatic.  Eyes:     Pupils: Pupils are equal, round, and reactive to light.     Comments: Pupils are dilated bilaterally  Cardiovascular:     Rate and Rhythm: Normal rate and regular rhythm.     Heart sounds: Normal heart sounds.  Pulmonary:     Effort: Pulmonary effort is normal. No respiratory distress.     Breath sounds: Normal breath sounds. No wheezing or rales.  Chest:     Chest wall: No tenderness.  Abdominal:     General: Bowel sounds are normal.     Palpations: Abdomen is soft.     Tenderness: There is no abdominal tenderness. There is no guarding or rebound.  Musculoskeletal:        General: Normal range of motion.     Cervical back: Normal range of motion and neck supple.  Lymphadenopathy:     Cervical: No cervical adenopathy.  Skin:    General: Skin is warm and dry.     Findings: No rash.  Neurological:     Mental Status: She is alert and oriented to person, place, and time.     ED Results / Procedures / Treatments   Labs (all labs ordered are listed, but only abnormal results are displayed) Labs Reviewed  RESP PANEL BY RT-PCR (FLU A&B, COVID) [id number]  COMPREHENSIVE METABOLIC PANEL  ETHANOL  RAPID URINE DRUG SCREEN, HOSP PERFORMED  CBC WITH DIFFERENTIAL/PLATELET  ACETAMINOPHEN LEVEL  SALICYLATE LEVEL  LITHIUM LEVEL  I-STAT BETA HCG BLOOD, ED ([facility], [facility], [facility] ONLY)    EKG EKG Interpretation  Date/Time:  Sunday May 24 2020 22:07:09 EDT Ventricular Rate:  88 PR Interval:  202 QRS Duration: 119 QT Interval:  381 QTC Calculation: 461 R Axis:   56 Text Interpretation: Sinus rhythm Borderline prolonged PR interval Incomplete right bundle branch block Confirmed by Rolan Bucco [phone]) on 05/24/2020 10:10:36 PM   Radiology No results found.  Procedures Procedures   Medications Ordered in ED Medications  diphenhydrAMINE (BENADRYL) injection 25  mg (25 mg Intravenous Given 05/24/20 2152)  haloperidol lactate (HALDOL) injection 10 mg (10 mg Intramuscular Given 05/24/20 2152)    ED Course  I have reviewed the triage vital signs and the nursing notes.  Pertinent labs & imaging results that were available during my care of the patient were reviewed by me and considered in my medical decision making (see chart for details).    MDM  Rules/Calculators/A&P                          22:00 patient was very combative on arrival.  Was given Haldol/Benadryl and placed in soft restraints.  Labs are pending.  Patient care taken over by Dr. Adela Lank pending labs and reassessment.  Suspect she has some ingestion that she will need to metabolize. Final Clinical Impression(s) / ED Diagnoses Final diagnoses:  None    Rx / DC Orders ED Discharge Orders    None       Rolan Bucco, MD 05/24/20 2327

## 2020-05-25 LAB — COMPREHENSIVE METABOLIC PANEL
ALT: 20 U/L (ref 0–44)
AST: 25 U/L (ref 15–41)
Albumin: 4.8 g/dL (ref 3.5–5.0)
Alkaline Phosphatase: 87 U/L (ref 38–126)
Anion gap: 15 (ref 5–15)
BUN: 6 mg/dL (ref 6–20)
CO2: 19 mmol/L — ABNORMAL LOW (ref 22–32)
Calcium: 9.2 mg/dL (ref 8.9–10.3)
Chloride: 105 mmol/L (ref 98–111)
Creatinine, Ser: 0.97 mg/dL (ref 0.44–1.00)
GFR, Estimated: >60 mL/min (ref >60)
Glucose, Bld: 91 mg/dL (ref 70–99)
Potassium: 3.2 mmol/L — ABNORMAL LOW (ref 3.5–5.1)
Sodium: 139 mmol/L (ref 135–145)
Total Bilirubin: 0.5 mg/dL (ref 0.3–1.2)
Total Protein: 8.3 g/dL — ABNORMAL HIGH (ref 6.5–8.1)

## 2020-05-25 LAB — ETHANOL: Alcohol, Ethyl (B): 304 mg/dL (ref <10)

## 2020-05-25 LAB — SALICYLATE LEVEL: Salicylate Lvl: <7 mg/dL — ABNORMAL LOW (ref 7.0–30.0)

## 2020-05-25 LAB — ACETAMINOPHEN LEVEL: Acetaminophen (Tylenol), Serum: <10 ug/mL — ABNORMAL LOW (ref 10–30)

## 2020-05-25 MED ORDER — SODIUM CHLORIDE 0.9 % IV BOLUS
1000.0000 mL | Freq: Once | INTRAVENOUS | Status: AC
  Administered 2020-05-25: 1000 mL via INTRAVENOUS

## 2020-05-25 NOTE — ED Notes (Signed)
MD made aware of pt BP  

## 2020-05-25 NOTE — BH Assessment (Signed)
DISPOSITION: Gave clinical report to [name] , NP who determined Pt does not meet criteria for inpatient psychiatric treatment. Notified Dr. [name]  and [name], RN of disposition recommendation and the sitter utilization recommendation. Patient is psych cleared for discharge.
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
## 2020-05-25 NOTE — ED Notes (Signed)
TTS assessment beginning
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
## 2020-05-25 NOTE — ED Notes (Signed)
TTS assessment complete. 

## 2020-05-25 NOTE — ED Provider Notes (Signed)
Received the patient in signout from Dr. [name].  Briefly the patient is a 34 year old female that was found intoxicated holding her child's with thoughts of suicidality. Patient was acutely agitated and required sedation. Thought to be acutely intoxicated and plan for reassessment once sober.  Patient is now awake and alert though she denies any substance ingestion yesterday.  She denies any suicidal or homicidal ideation 30 with the patient endangering her child at home and being IVC'd by her significant other will have TTS evaluate.     [name], DO 05/25/20 [phone]

## 2020-05-25 NOTE — ED Notes (Signed)
Documenting Restraints according to Soft-restraint/non-violent restraint protocol under order form Belfi MD. Documentation every 2 hours.

## 2020-05-25 NOTE — ED Notes (Signed)
1 pt belonging bag returned to patient

## 2020-05-25 NOTE — ED Notes (Signed)
Pt calm at this time, following instructions. Restraints d/c. NAD. Pt informed of POC. Sitter at bedside.

## 2020-05-25 NOTE — ED Notes (Signed)
Pt cont attempting to pull wires off, get up. Pt confused, still not completely coherent at this time.

## 2020-05-25 NOTE — ED Notes (Signed)
Assumed care of pt at this time. Pt resting in stretcher, no s/sx of acute distress at this time. Pt cont in non-violent restraints.

## 2020-05-25 NOTE — ED Notes (Signed)
SW asked me to ask pt if there is anyone else they can contact for collateral, attempted to ask pt and she said "that's the only person I talk to, I don't talk to my family". SW made aware

## 2020-05-25 NOTE — ED Notes (Signed)
This nurse fixed orders for pt restraints to match the soft gurney cuffs the pt has been in for the duration of her restraint placement.

## 2020-05-25 NOTE — ED Provider Notes (Signed)
Patient cleared by psychiatry.  Discharged in good condition.   Virgina Norfolk, DO 05/25/20 [phone]

## 2020-05-28 ENCOUNTER — Ambulatory Visit: Payer: BC Managed Care – PPO | Admitting: Medical

## 2020-11-05 ENCOUNTER — Encounter: Payer: Self-pay | Admitting: Internal Medicine

## 2020-11-28 IMAGING — US US PELVIS COMPLETE WITH TRANSVAGINAL
1 series · 13 of 25 positions shown · non-contrast
Comparison: None

CLINICAL DATA: Initial evaluation for acute abnormal uterine
bleeding.



[Series 2: us pelvis complete with transvaginal · 13 of 56 slices shown]
[im 1/56]
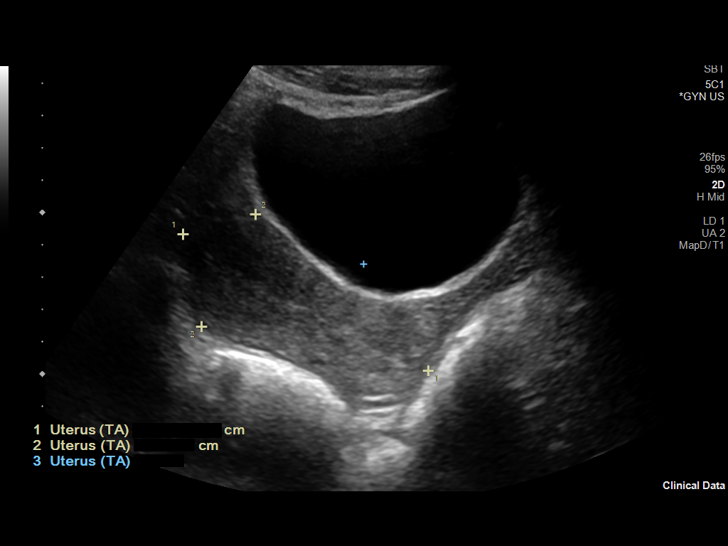
[im 5/56]
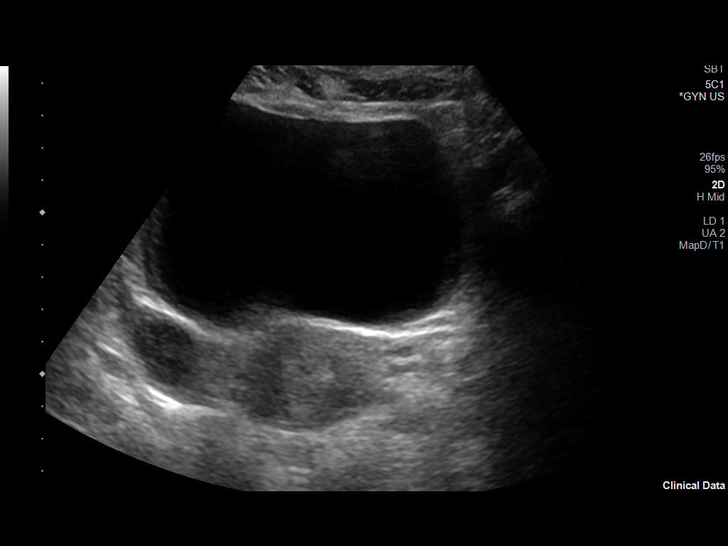
[im 10/56]
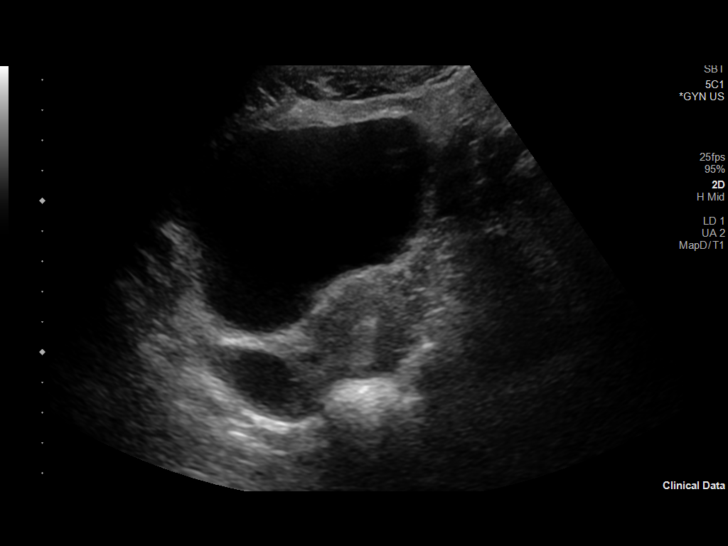
[im 14/56]
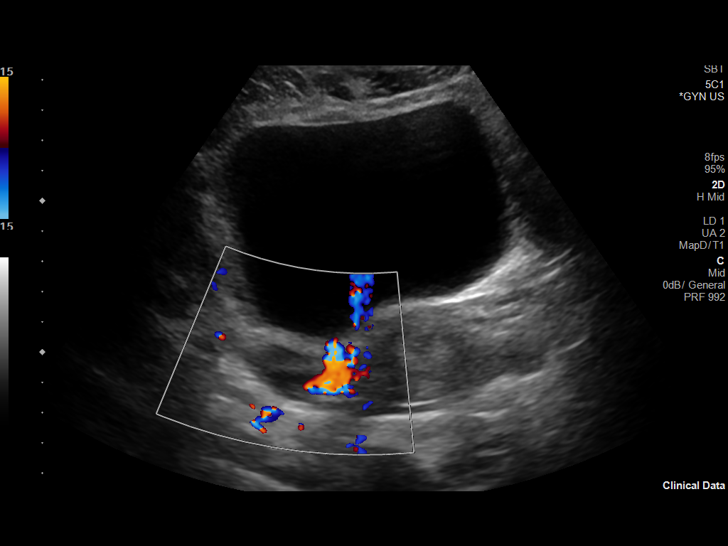
[im 19/56]
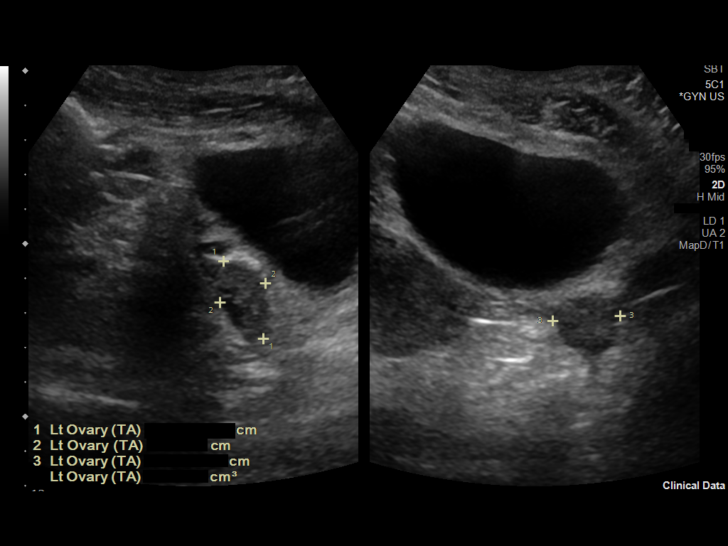
[im 23/56]
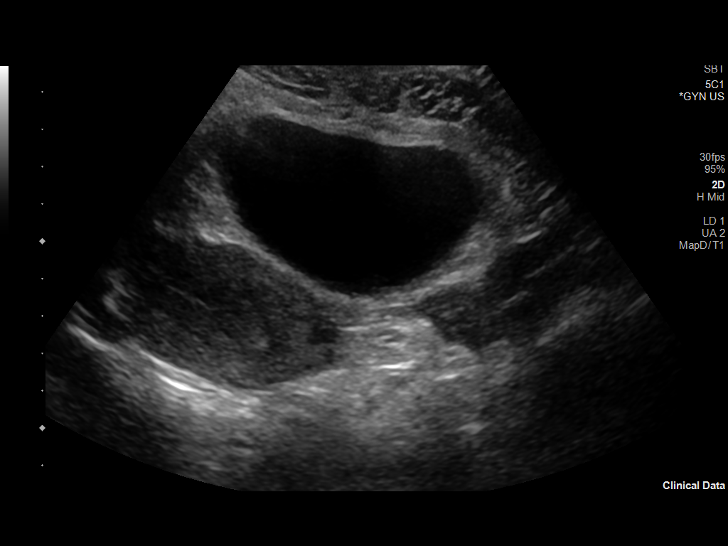
[im 28/56]
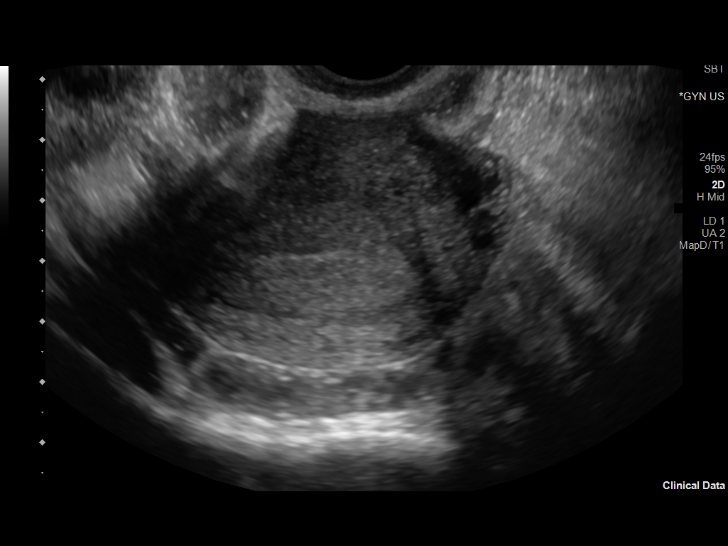
[im 33/56]
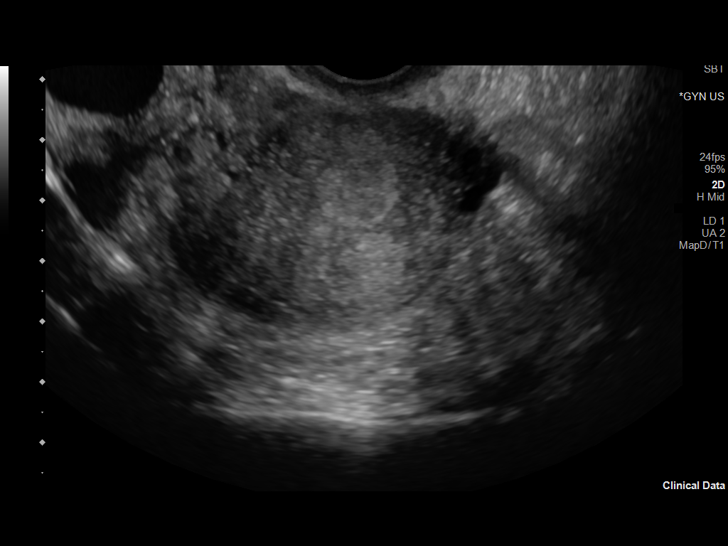
[im 37/56]
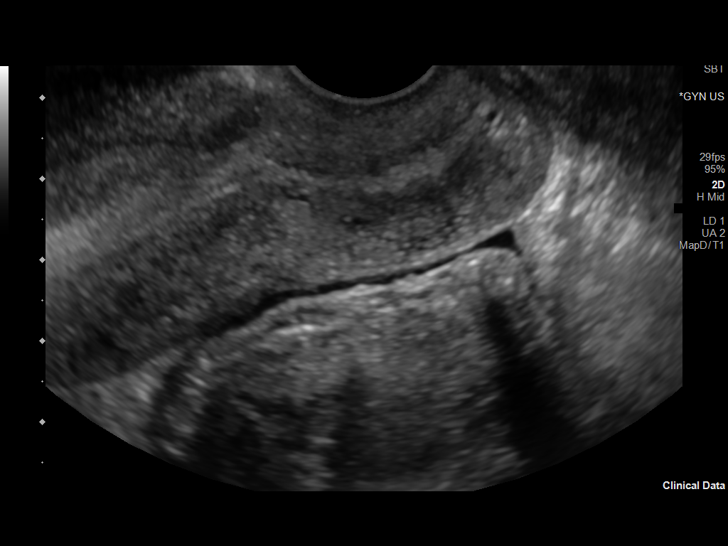
[im 42/56]
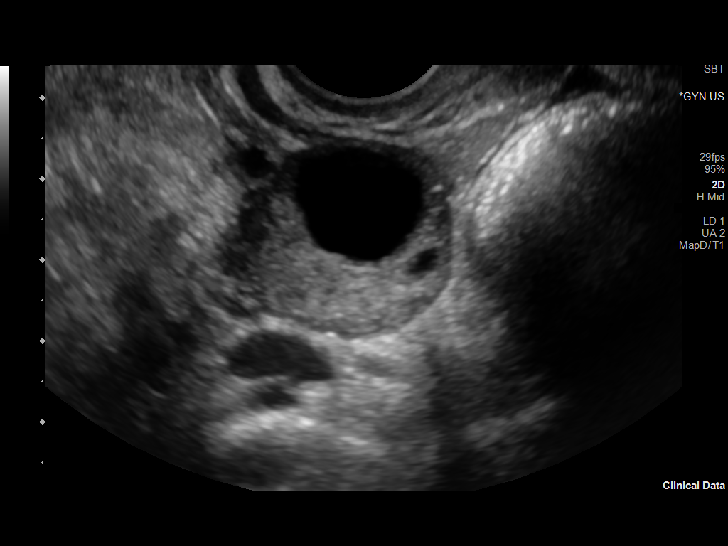
[im 46/56]
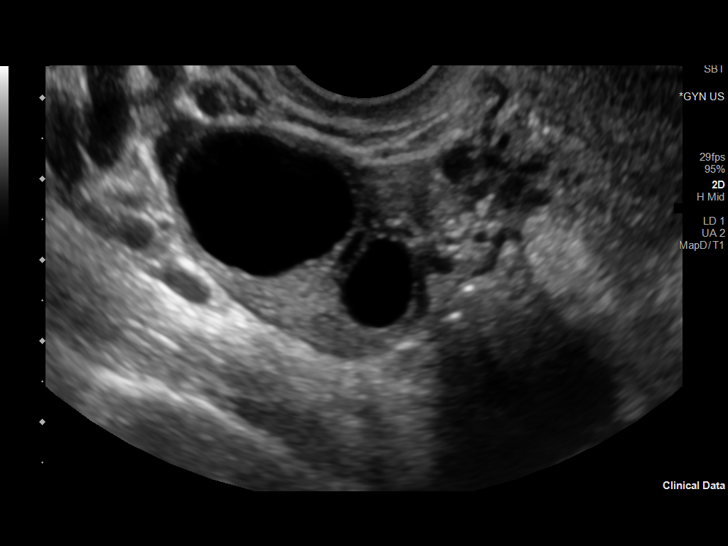
[im 51/56]
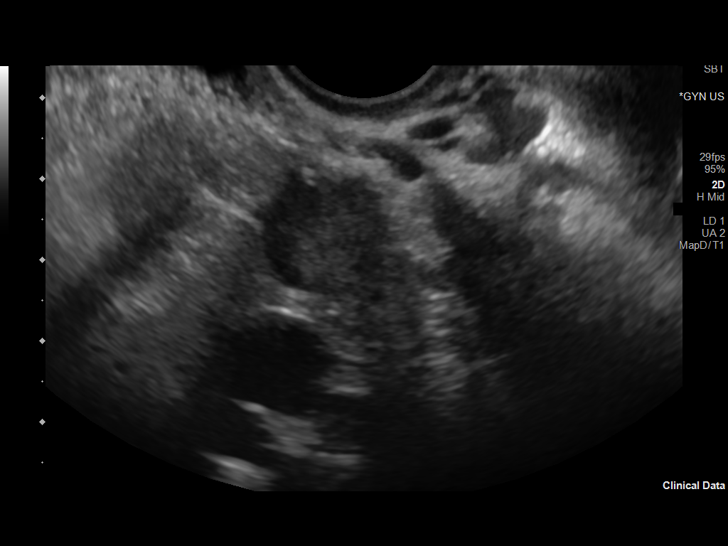
[im 56/56]
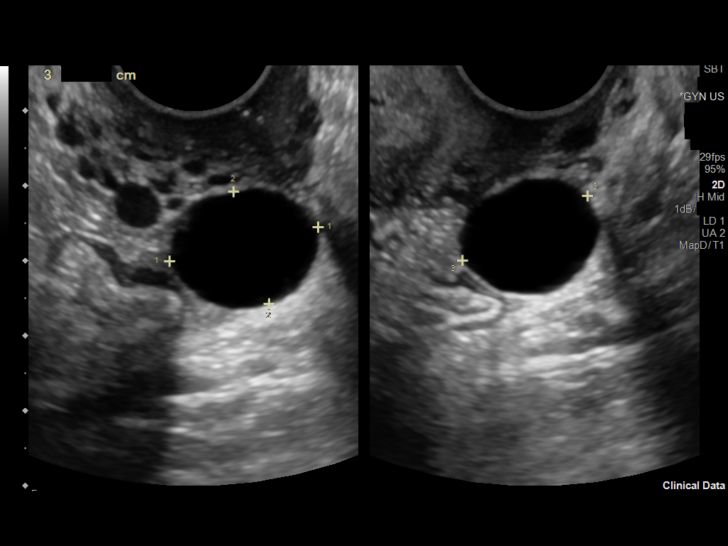

[13 of 25 positions shown; findings below may reference images not displayed]

FINDINGS: Uterus

Measurements: 8.2 x 3.9 x 5.7 cm = volume: 96 mL. No fibroids or
other mass visualized.

Endometrium

Thickness: 13 mm.  No focal abnormality visualized.

Right ovary

Measurements: 3.6 x 2.1 x 3.1 cm = volume: 12 mL. 2.1 cm simple
physiologic follicular cyst/dominant follicle noted. No other
adnexal mass.

Left ovary

Measurements: 2.4 x 1.6 x 1.9 cm = volume: 4 mL. 2.0 x 1.6 x 1.9 cm
simple cyst present within the left adnexa, likely a normal
physiologic follicular cyst/dominant follicle.

Other findings

No abnormal free fluid.
IMPRESSION: 1. Endometrial stripe measures 13 mm in thickness. If bleeding
remains unresponsive to hormonal or medical therapy, sonohysterogram
should be considered for focal lesion work-up. (Ref: Radiological
Reasoning: Algorithmic Workup of Abnormal Vaginal Bleeding with
Endovaginal Sonography and Sonohysterography. AJR 2338; 191:S68-73).
2. Approximate 2 cm simple bilateral adnexal cysts, most consistent
with normal physiologic follicular cyst/dominant follicles.
3. Otherwise unremarkable and normal pelvic ultrasound. No other
acute abnormality identified.

## 2021-02-03 DIAGNOSIS — M25532 Pain in left wrist: Secondary | ICD-10-CM | POA: Diagnosis not present

## 2021-02-03 DIAGNOSIS — M654 Radial styloid tenosynovitis [de Quervain]: Secondary | ICD-10-CM | POA: Diagnosis not present

## 2021-02-03 DIAGNOSIS — F17210 Nicotine dependence, cigarettes, uncomplicated: Secondary | ICD-10-CM | POA: Diagnosis not present

## 2021-02-03 DIAGNOSIS — Z6822 Body mass index (BMI) 22.0-22.9, adult: Secondary | ICD-10-CM | POA: Diagnosis not present

## 2021-02-03 DIAGNOSIS — N914 Secondary oligomenorrhea: Secondary | ICD-10-CM | POA: Diagnosis not present

## 2021-03-10 ENCOUNTER — Encounter: Payer: Self-pay | Admitting: Internal Medicine

## 2021-03-17 NOTE — Telephone Encounter (Signed)
Left message for pt to call back to update about flu shot

## 2021-08-11 ENCOUNTER — Telehealth: Payer: Self-pay | Admitting: Medical

## 2021-08-11 NOTE — Telephone Encounter (Signed)
Dismissal letter in guarantor snapshot  °

## 2022-03-01 ENCOUNTER — Emergency Department (HOSPITAL_COMMUNITY): Payer: 59

## 2022-03-01 ENCOUNTER — Encounter (HOSPITAL_COMMUNITY): Payer: Self-pay

## 2022-03-01 ENCOUNTER — Other Ambulatory Visit: Payer: Self-pay

## 2022-03-01 ENCOUNTER — Emergency Department (HOSPITAL_COMMUNITY)
Admission: EM | Admit: 2022-03-01 | Discharge: 2022-03-01 | Disposition: A | Discharge status: Home / Self Care | Payer: 59 | Attending: Emergency Medicine | Admitting: Emergency Medicine

## 2022-03-01 DIAGNOSIS — M79602 Pain in left arm: Secondary | ICD-10-CM | POA: Insufficient documentation

## 2022-03-01 DIAGNOSIS — S8991XA Unspecified injury of right lower leg, initial encounter: Secondary | ICD-10-CM | POA: Diagnosis present

## 2022-03-01 DIAGNOSIS — Y92410 Unspecified street and highway as the place of occurrence of the external cause: Secondary | ICD-10-CM | POA: Insufficient documentation

## 2022-03-01 DIAGNOSIS — M546 Pain in thoracic spine: Secondary | ICD-10-CM | POA: Diagnosis not present

## 2022-03-01 DIAGNOSIS — R0789 Other chest pain: Secondary | ICD-10-CM | POA: Diagnosis not present

## 2022-03-01 DIAGNOSIS — S0990XA Unspecified injury of head, initial encounter: Secondary | ICD-10-CM | POA: Diagnosis not present

## 2022-03-01 DIAGNOSIS — M542 Cervicalgia: Secondary | ICD-10-CM | POA: Insufficient documentation

## 2022-03-01 DIAGNOSIS — S8012XA Contusion of left lower leg, initial encounter: Secondary | ICD-10-CM | POA: Diagnosis not present

## 2022-03-01 DIAGNOSIS — M545 Low back pain, unspecified: Secondary | ICD-10-CM | POA: Insufficient documentation

## 2022-03-01 DIAGNOSIS — S8011XA Contusion of right lower leg, initial encounter: Secondary | ICD-10-CM | POA: Diagnosis not present

## 2022-03-01 LAB — POC URINE PREG, ED: Preg Test, Ur: NEGATIVE

## 2022-03-01 MED ORDER — METHOCARBAMOL 500 MG PO TABS
500.0000 mg | ORAL_TABLET | Freq: Once | ORAL | Status: AC
  Administered 2022-03-01: 500 mg via ORAL
  Filled 2022-03-01: qty 1

## 2022-03-01 MED ORDER — METHOCARBAMOL 500 MG PO TABS: 500.0000 mg | ORAL_TABLET | Freq: Two times a day (BID) | ORAL | 0 refills | Status: AC

## 2022-03-01 MED ORDER — HYDROCODONE-ACETAMINOPHEN 5-325 MG PO TABS
1.0000 | ORAL_TABLET | Freq: Once | ORAL | Status: AC
  Administered 2022-03-01: 1 via ORAL
  Filled 2022-03-01: qty 1

## 2022-03-01 MED ORDER — ACETAMINOPHEN ER 650 MG PO TBCR: 650.0000 mg | EXTENDED_RELEASE_TABLET | Freq: Three times a day (TID) | ORAL | 0 refills | Status: AC | PRN
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
## 2022-03-01 NOTE — ED Notes (Signed)
Opened chart at pts request to answer questions about her prescriptions.
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
